# Patient Record
Sex: Male | Born: 1955
Health system: Southern US, Community
[De-identification: ages and names within clinical notes are randomized; demographics above are authoritative.]

## PROBLEM LIST (undated history)

## (undated) DIAGNOSIS — S060XAA Concussion with loss of consciousness status unknown, initial encounter: Secondary | ICD-10-CM

## (undated) DIAGNOSIS — G8929 Other chronic pain: Secondary | ICD-10-CM

## (undated) DIAGNOSIS — E119 Type 2 diabetes mellitus without complications: Secondary | ICD-10-CM

## (undated) DIAGNOSIS — T7840XA Allergy, unspecified, initial encounter: Secondary | ICD-10-CM

## (undated) DIAGNOSIS — E049 Nontoxic goiter, unspecified: Secondary | ICD-10-CM

## (undated) DIAGNOSIS — F32A Depression, unspecified: Secondary | ICD-10-CM

## (undated) DIAGNOSIS — T8859XA Other complications of anesthesia, initial encounter: Secondary | ICD-10-CM

## (undated) DIAGNOSIS — F329 Major depressive disorder, single episode, unspecified: Secondary | ICD-10-CM

## (undated) DIAGNOSIS — M4800 Spinal stenosis, site unspecified: Secondary | ICD-10-CM

## (undated) DIAGNOSIS — F419 Anxiety disorder, unspecified: Secondary | ICD-10-CM

## (undated) DIAGNOSIS — G4730 Sleep apnea, unspecified: Secondary | ICD-10-CM

## (undated) DIAGNOSIS — R112 Nausea with vomiting, unspecified: Secondary | ICD-10-CM

## (undated) DIAGNOSIS — C61 Malignant neoplasm of prostate: Secondary | ICD-10-CM

## (undated) DIAGNOSIS — E785 Hyperlipidemia, unspecified: Secondary | ICD-10-CM

## (undated) DIAGNOSIS — I10 Essential (primary) hypertension: Secondary | ICD-10-CM

## (undated) DIAGNOSIS — M549 Dorsalgia, unspecified: Secondary | ICD-10-CM

## (undated) DIAGNOSIS — K219 Gastro-esophageal reflux disease without esophagitis: Secondary | ICD-10-CM

## (undated) DIAGNOSIS — J449 Chronic obstructive pulmonary disease, unspecified: Secondary | ICD-10-CM

## (undated) DIAGNOSIS — G709 Myoneural disorder, unspecified: Secondary | ICD-10-CM

## (undated) DIAGNOSIS — S060X9A Concussion with loss of consciousness of unspecified duration, initial encounter: Secondary | ICD-10-CM

## (undated) DIAGNOSIS — I739 Peripheral vascular disease, unspecified: Secondary | ICD-10-CM

## (undated) DIAGNOSIS — M1990 Unspecified osteoarthritis, unspecified site: Secondary | ICD-10-CM

## (undated) DIAGNOSIS — I499 Cardiac arrhythmia, unspecified: Secondary | ICD-10-CM

## (undated) DIAGNOSIS — K759 Inflammatory liver disease, unspecified: Secondary | ICD-10-CM

## (undated) HISTORY — DX: Depression, unspecified: F32.A

## (undated) HISTORY — PX: FRACTURE SURGERY: SHX138

## (undated) HISTORY — DX: Spinal stenosis, site unspecified: M48.00

## (undated) HISTORY — DX: Essential (primary) hypertension: I10

## (undated) HISTORY — DX: Concussion with loss of consciousness of unspecified duration, initial encounter: S06.0X9A

## (undated) HISTORY — DX: Hyperlipidemia, unspecified: E78.5

## (undated) HISTORY — DX: Nontoxic goiter, unspecified: E04.9

## (undated) HISTORY — PX: SPINE SURGERY: SHX786

## (undated) HISTORY — DX: Sleep apnea, unspecified: G47.30

## (undated) HISTORY — DX: Other chronic pain: G89.29

## (undated) HISTORY — DX: Malignant neoplasm of prostate: C61

## (undated) HISTORY — DX: Concussion with loss of consciousness status unknown, initial encounter: S06.0XAA

## (undated) HISTORY — DX: Allergy, unspecified, initial encounter: T78.40XA

## (undated) HISTORY — DX: Unspecified osteoarthritis, unspecified site: M19.90

## (undated) HISTORY — DX: Dorsalgia, unspecified: M54.9

## (undated) HISTORY — DX: Anxiety disorder, unspecified: F41.9

## (undated) HISTORY — PX: PROSTATE SURGERY: SHX751

## (undated) HISTORY — PX: OTHER SURGICAL HISTORY: SHX169

## (undated) HISTORY — DX: Major depressive disorder, single episode, unspecified: F32.9

---

## 2015-05-17 DIAGNOSIS — R972 Elevated prostate specific antigen [PSA]: Secondary | ICD-10-CM | POA: Insufficient documentation

## 2015-05-17 DIAGNOSIS — N400 Enlarged prostate without lower urinary tract symptoms: Secondary | ICD-10-CM | POA: Insufficient documentation

## 2015-07-29 ENCOUNTER — Telehealth: Payer: Self-pay | Admitting: Family Medicine

## 2015-07-29 NOTE — Telephone Encounter (Signed)
Pt called stated he needs refill on Lorazepam. Pharm is [facility] in [place]. Pt last seen in 01/28/15. Thanks.

## 2015-08-08 ENCOUNTER — Encounter: Payer: Self-pay | Admitting: Family Medicine

## 2015-08-08 ENCOUNTER — Ambulatory Visit (INDEPENDENT_AMBULATORY_CARE_PROVIDER_SITE_OTHER): Payer: 59 | Admitting: Family Medicine

## 2015-08-08 VITALS — BP 117/69 | HR 68 | Temp 98.8°F | Ht 68.5 in | Wt 193.0 lb

## 2015-08-08 DIAGNOSIS — F329 Major depressive disorder, single episode, unspecified: Secondary | ICD-10-CM

## 2015-08-08 DIAGNOSIS — I10 Essential (primary) hypertension: Secondary | ICD-10-CM | POA: Diagnosis not present

## 2015-08-08 DIAGNOSIS — F32A Depression, unspecified: Secondary | ICD-10-CM | POA: Insufficient documentation

## 2015-08-08 DIAGNOSIS — E785 Hyperlipidemia, unspecified: Secondary | ICD-10-CM

## 2015-08-08 LAB — LP+ALT+AST PICCOLO, WAIVED
ALT (SGPT) Piccolo, Waived: 32 U/L (ref 10–47)
AST (SGOT) Piccolo, Waived: 29 U/L (ref 11–38)
Chol/HDL Ratio Piccolo,Waive: 3 mg/dL
Cholesterol Piccolo, Waived: 152 mg/dL (ref <200)
HDL CHOL PICCOLO, WAIVED: 51 mg/dL — AB (ref >59)
LDL CHOL CALC PICCOLO WAIVED: 64 mg/dL (ref <100)
TRIGLYCERIDES PICCOLO,WAIVED: 187 mg/dL — AB (ref <150)
VLDL CHOL CALC PICCOLO,WAIVE: 37 mg/dL — AB (ref <30)

## 2015-08-08 MED ORDER — ATORVASTATIN CALCIUM 40 MG PO TABS: 40.0000 mg | ORAL_TABLET | Freq: Every day | ORAL | Status: DC

## 2015-08-08 MED ORDER — CITALOPRAM HYDROBROMIDE 40 MG PO TABS: 40.0000 mg | ORAL_TABLET | Freq: Every day | ORAL | Status: DC

## 2015-08-08 MED ORDER — LISINOPRIL 20 MG PO TABS: 20.0000 mg | ORAL_TABLET | Freq: Every day | ORAL | Status: DC

## 2015-08-08 MED ORDER — LORAZEPAM 1 MG PO TABS
1.0000 mg | ORAL_TABLET | Freq: Every day | ORAL | Status: DC | PRN
Start: 2015-08-08 — End: 2016-03-17

## 2015-08-08 NOTE — Assessment & Plan Note (Signed)
The current medical regimen is effective;  continue present plan and medications.  

## 2015-08-08 NOTE — Progress Notes (Signed)
   BP 117/69 mmHg  Pulse 68  Temp(Src) 98.8 F (37.1 C)  Ht 5' 8.5" (1.74 m)  Wt 193 lb (87.544 kg)  BMI 28.92 kg/m2  SpO2 97%   Subjective:    Patient ID: William Sexton, male    DOB: March 25, 1956, 59 y.o.   MRN: [id number]  HPI: William Sexton is a 59 y.o. male  Chief Complaint  Patient presents with  . Anxiety    med refill   patient doing well on medications cholesterol and blood pressure no side effects from medications takes faithfully. Anxiety depression doing stable has used 60 lorazepam and this last year on chart review Wanting refill having some extra stress with his granddaughter at this time. Celexa is doing okay for depression.   Relevant past medical, surgical, family and social history reviewed and updated as indicated. Interim medical history since our last visit reviewed. Allergies and medications reviewed and updated.  Review of Systems  Constitutional: Negative.   Respiratory: Negative.   Cardiovascular: Negative.     Per HPI unless specifically indicated above     Objective:    BP 117/69 mmHg  Pulse 68  Temp(Src) 98.8 F (37.1 C)  Ht 5' 8.5" (1.74 m)  Wt 193 lb (87.544 kg)  BMI 28.92 kg/m2  SpO2 97%  Wt Readings from Last 3 Encounters:  08/08/15 193 lb (87.544 kg)  01/28/15 197 lb (89.359 kg)    Physical Exam  Constitutional: He is oriented to person, place, and time. He appears well-developed and well-nourished. No distress.  HENT:  Head: Normocephalic and atraumatic.  Right Ear: Hearing normal.  Left Ear: Hearing normal.  Nose: Nose normal.  Eyes: Conjunctivae and lids are normal. Right eye exhibits no discharge. Left eye exhibits no discharge. No scleral icterus.  Cardiovascular: Normal rate, regular rhythm and normal heart sounds.   Pulmonary/Chest: Effort normal and breath sounds normal. No respiratory distress.  Musculoskeletal: Normal range of motion.  Neurological: He is alert and oriented to person, place, and time.  Skin: Skin is  intact. No rash noted.  Psychiatric: He has a normal mood and affect. His speech is normal and behavior is normal. Judgment and thought content normal. Cognition and memory are normal.    No results found for this or any previous visit.    Assessment & Plan:   Problem List Items Addressed This Visit      Cardiovascular and Mediastinum   Hypertension - Primary    The current medical regimen is effective;  continue present plan and medications.       Relevant Medications   atorvastatin (LIPITOR) 40 MG tablet   lisinopril (PRINIVIL,ZESTRIL) 20 MG tablet   Other Relevant Orders   Basic metabolic panel     Other   Depression    The current medical regimen is effective;  continue present plan and medications.       Relevant Medications   citalopram (CELEXA) 40 MG tablet   LORazepam (ATIVAN) 1 MG tablet   Hyperlipidemia    The current medical regimen is effective;  continue present plan and medications.       Relevant Medications   atorvastatin (LIPITOR) 40 MG tablet   lisinopril (PRINIVIL,ZESTRIL) 20 MG tablet   Other Relevant Orders   LP+ALT+AST Piccolo, Waived       Follow up plan: Return in about 3 months (around 11/08/2015) for Physical Exam.

## 2015-08-09 LAB — BASIC METABOLIC PANEL
BUN/Creatinine Ratio: 13 (ref 9–20)
BUN: 10 mg/dL (ref 6–24)
CO2: 26 mmol/L (ref 18–29)
CREATININE: 0.8 mg/dL (ref 0.76–1.27)
Calcium: 9.6 mg/dL (ref 8.7–10.2)
Chloride: 100 mmol/L (ref 97–108)
GFR, EST AFRICAN AMERICAN: 114 mL/min/{1.73_m2} (ref >59)
GFR, EST NON AFRICAN AMERICAN: 98 mL/min/{1.73_m2} (ref >59)
Glucose: 82 mg/dL (ref 65–99)
Potassium: 4.7 mmol/L (ref 3.5–5.2)
SODIUM: 141 mmol/L (ref 134–144)

## 2015-08-12 ENCOUNTER — Encounter: Payer: Self-pay | Admitting: Family Medicine

## 2015-10-31 ENCOUNTER — Ambulatory Visit (INDEPENDENT_AMBULATORY_CARE_PROVIDER_SITE_OTHER): Payer: 59 | Admitting: Family Medicine

## 2015-10-31 ENCOUNTER — Encounter: Payer: Self-pay | Admitting: Family Medicine

## 2015-10-31 VITALS — BP 109/68 | HR 72 | Temp 98.4°F | Ht 68.3 in | Wt 195.0 lb

## 2015-10-31 DIAGNOSIS — F329 Major depressive disorder, single episode, unspecified: Secondary | ICD-10-CM

## 2015-10-31 DIAGNOSIS — I10 Essential (primary) hypertension: Secondary | ICD-10-CM

## 2015-10-31 DIAGNOSIS — E785 Hyperlipidemia, unspecified: Secondary | ICD-10-CM | POA: Diagnosis not present

## 2015-10-31 DIAGNOSIS — Z0000 Encounter for general adult medical examination without abnormal findings: Secondary | ICD-10-CM

## 2015-10-31 DIAGNOSIS — F32A Depression, unspecified: Secondary | ICD-10-CM

## 2015-10-31 LAB — URINALYSIS, ROUTINE W REFLEX MICROSCOPIC
Bilirubin, UA: NEGATIVE
GLUCOSE, UA: NEGATIVE
Ketones, UA: NEGATIVE
Leukocytes, UA: NEGATIVE
Nitrite, UA: NEGATIVE
Protein, UA: NEGATIVE
RBC, UA: NEGATIVE
Specific Gravity, UA: 1.03 (ref 1.005–1.030)
UUROB: 0.2 mg/dL (ref 0.2–1.0)
pH, UA: 5.5 (ref 5.0–7.5)

## 2015-10-31 MED ORDER — ATORVASTATIN CALCIUM 40 MG PO TABS: 40.0000 mg | ORAL_TABLET | Freq: Every day | ORAL | Status: DC

## 2015-10-31 MED ORDER — CITALOPRAM HYDROBROMIDE 40 MG PO TABS: 40.0000 mg | ORAL_TABLET | Freq: Every day | ORAL | Status: DC

## 2015-10-31 MED ORDER — LISINOPRIL 20 MG PO TABS: 20.0000 mg | ORAL_TABLET | Freq: Every day | ORAL | Status: DC

## 2015-10-31 NOTE — Assessment & Plan Note (Signed)
The current medical regimen is effective;  continue present plan and medications.  

## 2015-10-31 NOTE — Progress Notes (Signed)
BP 109/68 mmHg  Pulse 72  Temp(Src) 98.4 F (36.9 C)  Ht 5' 8.3" (1.735 m)  Wt 195 lb (88.451 kg)  BMI 29.38 kg/m2  SpO2 95%   Subjective:    Patient ID: William Sexton, male    DOB: 1956-01-21, 59 y.o.   MRN: [id number]  HPI: William Sexton is a 59 y.o. male  Chief Complaint  Patient presents with  . Annual Exam   patient with a cold for the last week or so wakes up to 3:00 coughing some some nasal congestion and drainage no fever or chills His taken some Tylenol no other medicines also tried some Mucinex didn't work  Patient taken citalopram 40 mg tried to cut back on medications to 20 because he is concerned about possibility of heart rhythm problems which he has none. And started getting irritable and has gone back on full dose and doing okay now. Takes Lipitor with no problems Lisinopril with no problems Rare use of lorazepam   Relevant past medical, surgical, family and social history reviewed and updated as indicated. Interim medical history since our last visit reviewed. Allergies and medications reviewed and updated.  Review of Systems  Constitutional: Negative.   HENT: Negative.   Eyes: Negative.   Respiratory: Negative.   Cardiovascular: Negative.   Gastrointestinal: Negative.   Endocrine: Negative.   Genitourinary: Negative.   Musculoskeletal: Negative.   Skin: Negative.   Allergic/Immunologic: Negative.   Neurological: Negative.   Hematological: Negative.   Psychiatric/Behavioral: Negative.     Per HPI unless specifically indicated above     Objective:    BP 109/68 mmHg  Pulse 72  Temp(Src) 98.4 F (36.9 C)  Ht 5' 8.3" (1.735 m)  Wt 195 lb (88.451 kg)  BMI 29.38 kg/m2  SpO2 95%  Wt Readings from Last 3 Encounters:  10/31/15 195 lb (88.451 kg)  08/08/15 193 lb (87.544 kg)  01/28/15 197 lb (89.359 kg)    Physical Exam  Constitutional: He is oriented to person, place, and time. He appears well-developed and well-nourished.  HENT:  Head:  Normocephalic and atraumatic.  Right Ear: External ear normal.  Left Ear: External ear normal.  Eyes: Conjunctivae and EOM are normal. Pupils are equal, round, and reactive to light.  Neck: Normal range of motion. Neck supple.  Cardiovascular: Normal rate, regular rhythm, normal heart sounds and intact distal pulses.   Pulmonary/Chest: Effort normal and breath sounds normal.  Abdominal: Soft. Bowel sounds are normal. There is no splenomegaly or hepatomegaly.  Genitourinary: Rectum normal, prostate normal and penis normal.  Musculoskeletal: Normal range of motion.  Neurological: He is alert and oriented to person, place, and time. He has normal reflexes.  Skin: No rash noted. No erythema.  Psychiatric: He has a normal mood and affect. His behavior is normal. Judgment and thought content normal.    Results for orders placed or performed in visit on 08/08/15  LP+ALT+AST Piccolo, [employer]  Result Value Ref Range   ALT (SGPT) Piccolo, Waived 32 10 - 47 U/L   AST (SGOT) Piccolo, Waived 29 11 - 38 U/L   Cholesterol Piccolo, Waived 152 <200 mg/dL   HDL Chol Piccolo, Waived 51 (L) >59 mg/dL   Triglycerides Piccolo,Waived 187 (H) <150 mg/dL   Chol/HDL Ratio Piccolo,Waive 3.0 mg/dL   LDL Chol Calc Piccolo Waived 64 <100 mg/dL   VLDL Chol Calc Piccolo,Waive 37 (H) <30 mg/dL  Basic metabolic panel  Result Value Ref Range   Glucose 82 65 - 99 mg/dL  BUN 10 6 - 24 mg/dL   Creatinine, Ser 0.80 0.76 - 1.27 mg/dL   GFR calc non Af Amer 98 >59 mL/min/1.73   GFR calc Af Amer 114 >59 mL/min/1.73   BUN/Creatinine Ratio 13 9 - 20   Sodium 141 134 - 144 mmol/L   Potassium 4.7 3.5 - 5.2 mmol/L   Chloride 100 97 - 108 mmol/L   CO2 26 18 - 29 mmol/L   Calcium 9.6 8.7 - 10.2 mg/dL      Assessment & Plan:   Problem List Items Addressed This Visit      Cardiovascular and Mediastinum   Hypertension    The current medical regimen is effective;  continue present plan and medications.       Relevant  Medications   atorvastatin (LIPITOR) 40 MG tablet   lisinopril (PRINIVIL,ZESTRIL) 20 MG tablet     Other   Hyperlipidemia    The current medical regimen is effective;  continue present plan and medications.       Relevant Medications   atorvastatin (LIPITOR) 40 MG tablet   lisinopril (PRINIVIL,ZESTRIL) 20 MG tablet   Depression    The current medical regimen is effective;  continue present plan and medications.       Relevant Medications   citalopram (CELEXA) 40 MG tablet    Other Visit Diagnoses    Routine general medical examination at a health care facility    -  Primary    Relevant Orders    CBC with Differential/Platelet    Comprehensive metabolic panel    Lipid Panel w/o Chol/HDL Ratio    PSA    TSH    Urinalysis, Routine w reflex microscopic (not at [facility])        Follow up plan: Return in about 6 months (around 04/29/2016) for BMP , lipids, alt, ast.

## 2015-11-01 LAB — CBC WITH DIFFERENTIAL/PLATELET
BASOS ABS: 0 10*3/uL (ref 0.0–0.2)
Basos: 0 %
EOS (ABSOLUTE): 0.1 10*3/uL (ref 0.0–0.4)
Eos: 1 %
Hematocrit: 42.4 % (ref 37.5–51.0)
Hemoglobin: 14.4 g/dL (ref 12.6–17.7)
IMMATURE GRANS (ABS): 0.1 10*3/uL (ref 0.0–0.1)
Immature Granulocytes: 1 %
LYMPHS: 21 %
Lymphocytes Absolute: 2 10*3/uL (ref 0.7–3.1)
MCH: 30.4 pg (ref 26.6–33.0)
MCHC: 34 g/dL (ref 31.5–35.7)
MCV: 90 fL (ref 79–97)
MONOS ABS: 1.1 10*3/uL — AB (ref 0.1–0.9)
Monocytes: 11 %
NEUTROS ABS: 6.3 10*3/uL (ref 1.4–7.0)
NEUTROS PCT: 66 %
PLATELETS: 257 10*3/uL (ref 150–379)
RBC: 4.74 x10E6/uL (ref 4.14–5.80)
RDW: 13.2 % (ref 12.3–15.4)
WBC: 9.6 10*3/uL (ref 3.4–10.8)

## 2015-11-01 LAB — LIPID PANEL W/O CHOL/HDL RATIO
CHOLESTEROL TOTAL: 156 mg/dL (ref 100–199)
HDL: 39 mg/dL — AB (ref >39)
LDL Calculated: 82 mg/dL (ref 0–99)
TRIGLYCERIDES: 175 mg/dL — AB (ref 0–149)
VLDL CHOLESTEROL CAL: 35 mg/dL (ref 5–40)

## 2015-11-01 LAB — COMPREHENSIVE METABOLIC PANEL
ALK PHOS: 87 IU/L (ref 39–117)
ALT: 50 IU/L — AB (ref 0–44)
AST: 24 IU/L (ref 0–40)
Albumin/Globulin Ratio: 1.7 (ref 1.1–2.5)
Albumin: 4.3 g/dL (ref 3.5–5.5)
BILIRUBIN TOTAL: 0.3 mg/dL (ref 0.0–1.2)
BUN/Creatinine Ratio: 13 (ref 9–20)
BUN: 12 mg/dL (ref 6–24)
CHLORIDE: 97 mmol/L (ref 97–106)
CO2: 25 mmol/L (ref 18–29)
Calcium: 9.3 mg/dL (ref 8.7–10.2)
Creatinine, Ser: 0.89 mg/dL (ref 0.76–1.27)
GFR calc Af Amer: 109 mL/min/{1.73_m2} (ref >59)
GFR calc non Af Amer: 94 mL/min/{1.73_m2} (ref >59)
GLUCOSE: 157 mg/dL — AB (ref 65–99)
Globulin, Total: 2.5 g/dL (ref 1.5–4.5)
POTASSIUM: 4.4 mmol/L (ref 3.5–5.2)
Sodium: 137 mmol/L (ref 136–144)
TOTAL PROTEIN: 6.8 g/dL (ref 6.0–8.5)

## 2015-11-01 LAB — PSA: Prostate Specific Ag, Serum: 4.5 ng/mL — ABNORMAL HIGH (ref 0.0–4.0)

## 2015-11-01 LAB — TSH: TSH: 0.796 u[IU]/mL (ref 0.450–4.500)

## 2015-11-05 ENCOUNTER — Telehealth: Payer: Self-pay | Admitting: Family Medicine

## 2015-11-05 DIAGNOSIS — R748 Abnormal levels of other serum enzymes: Secondary | ICD-10-CM

## 2015-11-05 NOTE — Telephone Encounter (Signed)
Phone call Discussed with patient elevated PSA patient's been followed by urology every 6 months Discuss elevated liver enzymes patient not drinking alcohol is taking a lot of medicines to try to get rid of cold Will recheck comprehensive metabolic panel in a couple of months

## 2015-11-05 NOTE — Telephone Encounter (Signed)
-----   Message from Wynn Maudlin, [place] sent at 11/05/2015 12:02 PM EST ----- labs

## 2016-03-10 ENCOUNTER — Ambulatory Visit: Payer: 59 | Admitting: Family Medicine

## 2016-03-17 ENCOUNTER — Ambulatory Visit (INDEPENDENT_AMBULATORY_CARE_PROVIDER_SITE_OTHER): Payer: 59 | Admitting: Family Medicine

## 2016-03-17 ENCOUNTER — Encounter: Payer: Self-pay | Admitting: Family Medicine

## 2016-03-17 VITALS — BP 121/70 | HR 71 | Temp 98.6°F | Ht 68.3 in | Wt 197.0 lb

## 2016-03-17 DIAGNOSIS — F329 Major depressive disorder, single episode, unspecified: Secondary | ICD-10-CM | POA: Diagnosis not present

## 2016-03-17 DIAGNOSIS — I10 Essential (primary) hypertension: Secondary | ICD-10-CM | POA: Diagnosis not present

## 2016-03-17 DIAGNOSIS — F32A Depression, unspecified: Secondary | ICD-10-CM

## 2016-03-17 NOTE — Assessment & Plan Note (Signed)
The current medical regimen is effective;  continue present plan and medications.  

## 2016-03-17 NOTE — Progress Notes (Signed)
BP 121/70 mmHg  Pulse 71  Temp(Src) 98.6 F (37 C)  Ht 5' 8.3" (1.735 m)  Wt 197 lb (89.359 kg)  BMI 29.69 kg/m2  SpO2 98%   Subjective:    Patient ID: William Sexton, male    DOB: 27-Jun-1956, 60 y.o.   MRN: [id number]  HPI: William Sexton is a 60 y.o. male  Chief Complaint  Patient presents with  . wants to reduce medication  . Gastroesophageal Reflux  . hand swelling   Patient taken citalopram 40 mg one half tablet concerned has been on citalopram 40 mg for years slipping 2 reduced dose and stop doing okay at best Still having some irritability Patient has intermittent hand swelling when he eats too much salt hand seemed to swell in the next day wondering about fluid retention doesn't have in his legs or feet Has occasional reflux takes over-the-counter seems to help okay  Relevant past medical, surgical, family and social history reviewed and updated as indicated. Interim medical history since our last visit reviewed. Allergies and medications reviewed and updated.  Review of Systems  Constitutional: Negative.   Respiratory: Negative.   Cardiovascular: Negative.     Per HPI unless specifically indicated above     Objective:    BP 121/70 mmHg  Pulse 71  Temp(Src) 98.6 F (37 C)  Ht 5' 8.3" (1.735 m)  Wt 197 lb (89.359 kg)  BMI 29.69 kg/m2  SpO2 98%  Wt Readings from Last 3 Encounters:  03/17/16 197 lb (89.359 kg)  10/31/15 195 lb (88.451 kg)  08/08/15 193 lb (87.544 kg)    Physical Exam  Constitutional: He is oriented to person, place, and time. He appears well-developed and well-nourished. No distress.  HENT:  Head: Normocephalic and atraumatic.  Right Ear: Hearing normal.  Left Ear: Hearing normal.  Nose: Nose normal.  Eyes: Conjunctivae and lids are normal. Right eye exhibits no discharge. Left eye exhibits no discharge. No scleral icterus.  Cardiovascular: Normal rate, regular rhythm and normal heart sounds.   Pulmonary/Chest: Effort normal and  breath sounds normal. No respiratory distress.  Musculoskeletal: Normal range of motion.  Neurological: He is alert and oriented to person, place, and time.  Skin: Skin is intact. No rash noted.  Psychiatric: He has a normal mood and affect. His speech is normal and behavior is normal. Judgment and thought content normal. Cognition and memory are normal.    Results for orders placed or performed in visit on 10/31/15  CBC with Differential/Platelet  Result Value Ref Range   WBC 9.6 3.4 - 10.8 x10E3/uL   RBC 4.74 4.14 - 5.80 x10E6/uL   Hemoglobin 14.4 12.6 - 17.7 g/dL   Hematocrit 42.4 37.5 - 51.0 %   MCV 90 79 - 97 fL   MCH 30.4 26.6 - 33.0 pg   MCHC 34.0 31.5 - 35.7 g/dL   RDW 13.2 12.3 - 15.4 %   Platelets 257 150 - 379 x10E3/uL   Neutrophils 66 %   Lymphs 21 %   Monocytes 11 %   Eos 1 %   Basos 0 %   Neutrophils Absolute 6.3 1.4 - 7.0 x10E3/uL   Lymphocytes Absolute 2.0 0.7 - 3.1 x10E3/uL   Monocytes Absolute 1.1 (H) 0.1 - 0.9 x10E3/uL   EOS (ABSOLUTE) 0.1 0.0 - 0.4 x10E3/uL   Basophils Absolute 0.0 0.0 - 0.2 x10E3/uL   Immature Granulocytes 1 %   Immature Grans (Abs) 0.1 0.0 - 0.1 x10E3/uL  Comprehensive metabolic panel  Result Value  Ref Range   Glucose 157 (H) 65 - 99 mg/dL   BUN 12 6 - 24 mg/dL   Creatinine, Ser 0.89 0.76 - 1.27 mg/dL   GFR calc non Af Amer 94 >59 mL/min/1.73   GFR calc Af Amer 109 >59 mL/min/1.73   BUN/Creatinine Ratio 13 9 - 20   Sodium 137 136 - 144 mmol/L   Potassium 4.4 3.5 - 5.2 mmol/L   Chloride 97 97 - 106 mmol/L   CO2 25 18 - 29 mmol/L   Calcium 9.3 8.7 - 10.2 mg/dL   Total Protein 6.8 6.0 - 8.5 g/dL   Albumin 4.3 3.5 - 5.5 g/dL   Globulin, Total 2.5 1.5 - 4.5 g/dL   Albumin/Globulin Ratio 1.7 1.1 - 2.5   Bilirubin Total 0.3 0.0 - 1.2 mg/dL   Alkaline Phosphatase 87 39 - 117 IU/L   AST 24 0 - 40 IU/L   ALT 50 (H) 0 - 44 IU/L  Lipid Panel w/o Chol/HDL Ratio  Result Value Ref Range   Cholesterol, Total 156 100 - 199 mg/dL    Triglycerides 175 (H) 0 - 149 mg/dL   HDL 39 (L) >39 mg/dL   VLDL Cholesterol Cal 35 5 - 40 mg/dL   LDL Calculated 82 0 - 99 mg/dL  PSA  Result Value Ref Range   Prostate Specific Ag, Serum 4.5 (H) 0.0 - 4.0 ng/mL  TSH  Result Value Ref Range   TSH 0.796 0.450 - 4.500 uIU/mL  Urinalysis, Routine w reflex microscopic (not at [facility])  Result Value Ref Range   Specific Gravity, UA 1.030 1.005 - 1.030   pH, UA 5.5 5.0 - 7.5   Color, UA Yellow Yellow   Appearance Ur Clear Clear   Leukocytes, UA Negative Negative   Protein, UA Negative Negative/Trace   Glucose, UA Negative Negative   Ketones, UA Negative Negative   RBC, UA Negative Negative   Bilirubin, UA Negative Negative   Urobilinogen, Ur 0.2 0.2 - 1.0 mg/dL   Nitrite, UA Negative Negative      Assessment & Plan:   Problem List Items Addressed This Visit      Cardiovascular and Mediastinum   Hypertension    The current medical regimen is effective;  continue present plan and medications.         Other   Depression - Primary    Some increased irritability with reduced dose of citalopram 40 to 20 mg Will continue on 20 mg observe response          Follow up plan: Return in about 2 months (around 05/17/2016) for BMP, lipids, alt, ast.

## 2016-03-17 NOTE — Assessment & Plan Note (Signed)
Some increased irritability with reduced dose of citalopram 40 to 20 mg Will continue on 20 mg observe response

## 2016-04-06 ENCOUNTER — Telehealth: Payer: Self-pay | Admitting: Family Medicine

## 2016-04-06 NOTE — Telephone Encounter (Signed)
Pt called stated he is taking his Citalopram 40mg  again. Wanted to let Dr. Jeananne Rama. Thanks.

## 2016-04-29 ENCOUNTER — Ambulatory Visit: Payer: 59 | Admitting: Family Medicine

## 2016-06-11 ENCOUNTER — Other Ambulatory Visit: Payer: Self-pay | Admitting: Family Medicine

## 2016-11-03 ENCOUNTER — Encounter: Payer: Self-pay | Admitting: Family Medicine

## 2016-11-03 ENCOUNTER — Ambulatory Visit (INDEPENDENT_AMBULATORY_CARE_PROVIDER_SITE_OTHER): Payer: 59 | Admitting: Family Medicine

## 2016-11-03 VITALS — BP 134/77 | HR 61 | Temp 98.3°F | Ht 69.0 in | Wt 193.0 lb

## 2016-11-03 DIAGNOSIS — I10 Essential (primary) hypertension: Secondary | ICD-10-CM | POA: Diagnosis not present

## 2016-11-03 DIAGNOSIS — Z0000 Encounter for general adult medical examination without abnormal findings: Secondary | ICD-10-CM | POA: Diagnosis not present

## 2016-11-03 DIAGNOSIS — F325 Major depressive disorder, single episode, in full remission: Secondary | ICD-10-CM | POA: Diagnosis not present

## 2016-11-03 DIAGNOSIS — Z125 Encounter for screening for malignant neoplasm of prostate: Secondary | ICD-10-CM | POA: Diagnosis not present

## 2016-11-03 DIAGNOSIS — R748 Abnormal levels of other serum enzymes: Secondary | ICD-10-CM

## 2016-11-03 DIAGNOSIS — E7800 Pure hypercholesterolemia, unspecified: Secondary | ICD-10-CM

## 2016-11-03 DIAGNOSIS — Z1211 Encounter for screening for malignant neoplasm of colon: Secondary | ICD-10-CM | POA: Diagnosis not present

## 2016-11-03 DIAGNOSIS — G4730 Sleep apnea, unspecified: Secondary | ICD-10-CM

## 2016-11-03 DIAGNOSIS — E782 Mixed hyperlipidemia: Secondary | ICD-10-CM

## 2016-11-03 DIAGNOSIS — E049 Nontoxic goiter, unspecified: Secondary | ICD-10-CM

## 2016-11-03 LAB — URINALYSIS, ROUTINE W REFLEX MICROSCOPIC
Bilirubin, UA: NEGATIVE
GLUCOSE, UA: NEGATIVE
KETONES UA: NEGATIVE
LEUKOCYTES UA: NEGATIVE
NITRITE UA: NEGATIVE
Protein, UA: NEGATIVE
RBC, UA: NEGATIVE
Specific Gravity, UA: 1.015 (ref 1.005–1.030)
Urobilinogen, Ur: 0.2 mg/dL (ref 0.2–1.0)
pH, UA: 7 (ref 5.0–7.5)

## 2016-11-03 MED ORDER — CITALOPRAM HYDROBROMIDE 40 MG PO TABS: 40.0000 mg | ORAL_TABLET | Freq: Every day | ORAL | 12 refills | Status: DC

## 2016-11-03 MED ORDER — LISINOPRIL 20 MG PO TABS: 20.0000 mg | ORAL_TABLET | Freq: Every day | ORAL | 12 refills | Status: DC

## 2016-11-03 MED ORDER — ATORVASTATIN CALCIUM 40 MG PO TABS: 40.0000 mg | ORAL_TABLET | Freq: Every day | ORAL | 12 refills | Status: DC

## 2016-11-03 NOTE — Assessment & Plan Note (Signed)
The current medical regimen is effective;  continue present plan and medications.  

## 2016-11-03 NOTE — Assessment & Plan Note (Signed)
Discussed sleep apnea importance of using CPAP and will consider turning off ramp and trying to read at night before going to bed to help get used to this machine.

## 2016-11-03 NOTE — Progress Notes (Signed)
BP 134/77   Pulse 61   Temp 98.3 F (36.8 C)   Ht 5\' 9"  (1.753 m)   Wt 193 lb (87.5 kg)   SpO2 98%   BMI 28.50 kg/m    Subjective:    Patient ID: William Sexton, male    DOB: May 06, 1956, 60 y.o.   MRN: [id number]  HPI: William Sexton is a 60 y.o. male  Chief Complaint  Patient presents with  . Annual Exam  Patient doing well no complaints blood pressure medicines and's citalopram cholesterol no issues. Taking medicines faithfully without side effects. Trying to get used to his new CPAP machine for sleep apnea. Patient had normal thyroid biopsy previously and is doing well.  Relevant past medical, surgical, family and social history reviewed and updated as indicated. Interim medical history since our last visit reviewed. Allergies and medications reviewed and updated.  Review of Systems  Constitutional: Negative.   HENT: Negative.   Eyes: Negative.   Respiratory: Negative.   Cardiovascular: Negative.   Gastrointestinal: Negative.   Endocrine: Negative.   Genitourinary: Negative.   Musculoskeletal: Negative.   Skin: Negative.   Allergic/Immunologic: Negative.   Neurological: Negative.   Hematological: Negative.   Psychiatric/Behavioral: Negative.     Per HPI unless specifically indicated above     Objective:    BP 134/77   Pulse 61   Temp 98.3 F (36.8 C)   Ht 5\' 9"  (1.753 m)   Wt 193 lb (87.5 kg)   SpO2 98%   BMI 28.50 kg/m   Wt Readings from Last 3 Encounters:  11/03/16 193 lb (87.5 kg)  03/17/16 197 lb (89.4 kg)  10/31/15 195 lb (88.5 kg)    Physical Exam  Constitutional: He is oriented to person, place, and time. He appears well-developed and well-nourished.  HENT:  Head: Normocephalic and atraumatic.  Right Ear: External ear normal.  Left Ear: External ear normal.  Eyes: Conjunctivae and EOM are normal. Pupils are equal, round, and reactive to light.  Neck: Normal range of motion. Neck supple. Thyromegaly present.  Cardiovascular: Normal rate,  regular rhythm, normal heart sounds and intact distal pulses.   Pulmonary/Chest: Effort normal and breath sounds normal.  Abdominal: Soft. Bowel sounds are normal. There is no splenomegaly or hepatomegaly.  Genitourinary:  Genitourinary Comments: Done at [facility]  Musculoskeletal: Normal range of motion.  Neurological: He is alert and oriented to person, place, and time. He has normal reflexes.  Skin: No rash noted. No erythema.  Psychiatric: He has a normal mood and affect. His behavior is normal. Judgment and thought content normal.    Results for orders placed or performed in visit on 10/31/15  CBC with Differential/Platelet  Result Value Ref Range   WBC 9.6 3.4 - 10.8 x10E3/uL   RBC 4.74 4.14 - 5.80 x10E6/uL   Hemoglobin 14.4 12.6 - 17.7 g/dL   Hematocrit 42.4 37.5 - 51.0 %   MCV 90 79 - 97 fL   MCH 30.4 26.6 - 33.0 pg   MCHC 34.0 31.5 - 35.7 g/dL   RDW 13.2 12.3 - 15.4 %   Platelets 257 150 - 379 x10E3/uL   Neutrophils 66 %   Lymphs 21 %   Monocytes 11 %   Eos 1 %   Basos 0 %   Neutrophils Absolute 6.3 1.4 - 7.0 x10E3/uL   Lymphocytes Absolute 2.0 0.7 - 3.1 x10E3/uL   Monocytes Absolute 1.1 (H) 0.1 - 0.9 x10E3/uL   EOS (ABSOLUTE) 0.1 0.0 - 0.4 x10E3/uL  Basophils Absolute 0.0 0.0 - 0.2 x10E3/uL   Immature Granulocytes 1 %   Immature Grans (Abs) 0.1 0.0 - 0.1 x10E3/uL  Comprehensive metabolic panel  Result Value Ref Range   Glucose 157 (H) 65 - 99 mg/dL   BUN 12 6 - 24 mg/dL   Creatinine, Ser 0.89 0.76 - 1.27 mg/dL   GFR calc non Af Amer 94 >59 mL/min/1.73   GFR calc Af Amer 109 >59 mL/min/1.73   BUN/Creatinine Ratio 13 9 - 20   Sodium 137 136 - 144 mmol/L   Potassium 4.4 3.5 - 5.2 mmol/L   Chloride 97 97 - 106 mmol/L   CO2 25 18 - 29 mmol/L   Calcium 9.3 8.7 - 10.2 mg/dL   Total Protein 6.8 6.0 - 8.5 g/dL   Albumin 4.3 3.5 - 5.5 g/dL   Globulin, Total 2.5 1.5 - 4.5 g/dL   Albumin/Globulin Ratio 1.7 1.1 - 2.5   Bilirubin Total 0.3 0.0 - 1.2 mg/dL   Alkaline  Phosphatase 87 39 - 117 IU/L   AST 24 0 - 40 IU/L   ALT 50 (H) 0 - 44 IU/L  Lipid Panel w/o Chol/HDL Ratio  Result Value Ref Range   Cholesterol, Total 156 100 - 199 mg/dL   Triglycerides 175 (H) 0 - 149 mg/dL   HDL 39 (L) >39 mg/dL   VLDL Cholesterol Cal 35 5 - 40 mg/dL   LDL Calculated 82 0 - 99 mg/dL  PSA  Result Value Ref Range   Prostate Specific Ag, Serum 4.5 (H) 0.0 - 4.0 ng/mL  TSH  Result Value Ref Range   TSH 0.796 0.450 - 4.500 uIU/mL  Urinalysis, Routine w reflex microscopic (not at [facility])  Result Value Ref Range   Specific Gravity, UA 1.030 1.005 - 1.030   pH, UA 5.5 5.0 - 7.5   Color, UA Yellow Yellow   Appearance Ur Clear Clear   Leukocytes, UA Negative Negative   Protein, UA Negative Negative/Trace   Glucose, UA Negative Negative   Ketones, UA Negative Negative   RBC, UA Negative Negative   Bilirubin, UA Negative Negative   Urobilinogen, Ur 0.2 0.2 - 1.0 mg/dL   Nitrite, UA Negative Negative      Assessment & Plan:   Problem List Items Addressed This Visit      Cardiovascular and Mediastinum   Hypertension - Primary    The current medical regimen is effective;  continue present plan and medications.       Relevant Medications   atorvastatin (LIPITOR) 40 MG tablet   lisinopril (PRINIVIL,ZESTRIL) 20 MG tablet   Other Relevant Orders   Comprehensive metabolic panel     Respiratory   Sleep apnea    Discussed sleep apnea importance of using CPAP and will consider turning off ramp and trying to read at night before going to bed to help get used to this machine.        Endocrine   Thyroid goiter    Also with nodules but stable by biopsy a couple of weeks ago.        Other   Depression    The current medical regimen is effective;  continue present plan and medications.       Relevant Medications   citalopram (CELEXA) 40 MG tablet   Hyperlipidemia   Relevant Medications   atorvastatin (LIPITOR) 40 MG tablet   lisinopril (PRINIVIL,ZESTRIL)  20 MG tablet   Other Relevant Orders   Lipid Profile    Other Visit  Diagnoses    Elevated liver enzymes       Routine general medical examination at a health care facility       Relevant Orders   CBC with Differential/Platelet   Urinalysis, Routine w reflex microscopic (not at [facility])   TSH   PSA   Screening PSA (prostate specific antigen)       Relevant Orders   PSA   Colon cancer screening       Relevant Orders   Ambulatory referral to Gastroenterology       Follow up plan: Return in about 6 months (around 05/03/2017) for BMP,  Lipids, ALT, AST.

## 2016-11-03 NOTE — Assessment & Plan Note (Signed)
Also with nodules but stable by biopsy a couple of weeks ago.

## 2016-11-04 ENCOUNTER — Telehealth: Payer: Self-pay | Admitting: Family Medicine

## 2016-11-04 ENCOUNTER — Encounter: Payer: Self-pay | Admitting: Family Medicine

## 2016-11-04 LAB — CBC WITH DIFFERENTIAL/PLATELET
BASOS: 0 %
Basophils Absolute: 0 10*3/uL (ref 0.0–0.2)
EOS (ABSOLUTE): 0.1 10*3/uL (ref 0.0–0.4)
EOS: 2 %
Hematocrit: 44.7 % (ref 37.5–51.0)
Hemoglobin: 15.5 g/dL (ref 12.6–17.7)
IMMATURE GRANS (ABS): 0 10*3/uL (ref 0.0–0.1)
IMMATURE GRANULOCYTES: 0 %
LYMPHS: 34 %
Lymphocytes Absolute: 1.7 10*3/uL (ref 0.7–3.1)
MCH: 31.5 pg (ref 26.6–33.0)
MCHC: 34.7 g/dL (ref 31.5–35.7)
MCV: 91 fL (ref 79–97)
Monocytes Absolute: 0.7 10*3/uL (ref 0.1–0.9)
Monocytes: 14 %
NEUTROS PCT: 50 %
Neutrophils Absolute: 2.5 10*3/uL (ref 1.4–7.0)
PLATELETS: 201 10*3/uL (ref 150–379)
RBC: 4.92 x10E6/uL (ref 4.14–5.80)
RDW: 13.1 % (ref 12.3–15.4)
WBC: 5.1 10*3/uL (ref 3.4–10.8)

## 2016-11-04 LAB — COMPREHENSIVE METABOLIC PANEL
A/G RATIO: 2 (ref 1.2–2.2)
ALT: 32 IU/L (ref 0–44)
AST: 25 IU/L (ref 0–40)
Albumin: 4.5 g/dL (ref 3.5–5.5)
Alkaline Phosphatase: 65 IU/L (ref 39–117)
BUN/Creatinine Ratio: 13 (ref 9–20)
BUN: 12 mg/dL (ref 6–24)
Bilirubin Total: 0.7 mg/dL (ref 0.0–1.2)
CALCIUM: 10.2 mg/dL (ref 8.7–10.2)
CO2: 26 mmol/L (ref 18–29)
CREATININE: 0.92 mg/dL (ref 0.76–1.27)
Chloride: 97 mmol/L (ref 96–106)
GFR calc Af Amer: 105 mL/min/{1.73_m2} (ref >59)
GFR, EST NON AFRICAN AMERICAN: 91 mL/min/{1.73_m2} (ref >59)
Globulin, Total: 2.3 g/dL (ref 1.5–4.5)
Glucose: 130 mg/dL — ABNORMAL HIGH (ref 65–99)
POTASSIUM: 4.6 mmol/L (ref 3.5–5.2)
Sodium: 140 mmol/L (ref 134–144)
TOTAL PROTEIN: 6.8 g/dL (ref 6.0–8.5)

## 2016-11-04 LAB — LIPID PANEL
CHOL/HDL RATIO: 3.4 ratio (ref 0.0–5.0)
Cholesterol, Total: 161 mg/dL (ref 100–199)
HDL: 48 mg/dL (ref >39)
LDL CALC: 90 mg/dL (ref 0–99)
TRIGLYCERIDES: 115 mg/dL (ref 0–149)
VLDL CHOLESTEROL CAL: 23 mg/dL (ref 5–40)

## 2016-11-04 LAB — PSA: Prostate Specific Ag, Serum: 7.4 ng/mL — ABNORMAL HIGH (ref 0.0–4.0)

## 2016-11-04 LAB — TSH: TSH: 0.997 u[IU]/mL (ref 0.450–4.500)

## 2016-11-04 LAB — PLEASE NOTE

## 2016-11-04 NOTE — Telephone Encounter (Signed)
Phone call Discussed with patient elevated PSA patient has urologist follow-up with will make an appointment.

## 2016-11-16 ENCOUNTER — Telehealth: Payer: Self-pay

## 2016-11-16 ENCOUNTER — Other Ambulatory Visit: Payer: Self-pay

## 2016-11-16 NOTE — Telephone Encounter (Signed)
Screening Colonoscopy Z12.11 [facility] 01/08/2017 Dr. Vicente Males [facility] Pre cert

## 2016-11-16 NOTE — Telephone Encounter (Signed)
Gastroenterology Pre-Procedure Review  Request Date: 01/08/2017 Requesting Physician: Dr. Jeananne Rama    PATIENT REVIEW QUESTIONS: The patient responded to the following health history questions as indicated:    1. Are you having any GI issues? no 2. Do you have a personal history of Polyps? no 3. Do you have a family history of Colon Cancer or Polyps? no 4. Diabetes Mellitus? no 5. Joint replacements in the past 12 months?no 6. Major health problems in the past 3 months?no 7. Any artificial heart valves, MVP, or defibrillator?no    MEDICATIONS & ALLERGIES:    Patient reports the following regarding taking any anticoagulation/antiplatelet therapy:   Plavix, Coumadin, Eliquis, Xarelto, Lovenox, Pradaxa, Brilinta, or Effient? no Aspirin? no  Patient confirms/reports the following medications:  Current Outpatient Prescriptions  Medication Sig Dispense Refill  . atorvastatin (LIPITOR) 40 MG tablet Take 1 tablet (40 mg total) by mouth daily. 30 tablet 12  . citalopram (CELEXA) 40 MG tablet     . lisinopril (PRINIVIL,ZESTRIL) 20 MG tablet Take 1 tablet (20 mg total) by mouth daily. 30 tablet 12  . naproxen (NAPROSYN) 500 MG tablet Take 500 mg by mouth daily as needed.     . fluticasone (FLONASE) 50 MCG/ACT nasal spray      No current facility-administered medications for this visit.     Patient confirms/reports the following allergies:  Allergies  Allergen Reactions  . Other Other (See Comments)    Environmental/seasonal    No orders of the defined types were placed in this encounter.   AUTHORIZATION INFORMATION Primary Insurance: 1D#: Group #:  Secondary Insurance: 1D#: Group #:  SCHEDULE INFORMATION: Date: 01/08/2017 Time: Location: [place]

## 2016-11-24 NOTE — Telephone Encounter (Signed)
The notification/prior authorization case information was transmitted on 11/24/2016 at 2:34 PM CST. The notification/prior authorization reference number is [id number]. Please print this page for your records.

## 2016-12-28 ENCOUNTER — Telehealth: Payer: Self-pay | Admitting: Family Medicine

## 2016-12-28 ENCOUNTER — Telehealth: Payer: Self-pay | Admitting: Gastroenterology

## 2016-12-28 NOTE — Telephone Encounter (Signed)
Pt scheduled for follow up

## 2016-12-28 NOTE — Telephone Encounter (Signed)
Patient called because he was seen at the [place] and they have told him he is a diabetic.  He is concerned about this as Dr. Jeananne Rama has never diagnosed him with this.  He is asking for a nurse to speak with him regarding this.  Thank You Santiago Glad

## 2016-12-28 NOTE — Telephone Encounter (Signed)
Patient need to move his colonoscopy into Feb. He is having trouble with his prostate.

## 2016-12-29 NOTE — Telephone Encounter (Signed)
Pt having medical issues and will call back after appt with PCP to reschedule.

## 2016-12-31 ENCOUNTER — Ambulatory Visit (INDEPENDENT_AMBULATORY_CARE_PROVIDER_SITE_OTHER): Payer: 59 | Admitting: Family Medicine

## 2016-12-31 ENCOUNTER — Encounter: Payer: Self-pay | Admitting: Family Medicine

## 2016-12-31 VITALS — BP 132/76 | HR 69 | Temp 98.2°F | Ht 70.0 in | Wt 196.0 lb

## 2016-12-31 DIAGNOSIS — N400 Enlarged prostate without lower urinary tract symptoms: Secondary | ICD-10-CM | POA: Insufficient documentation

## 2016-12-31 DIAGNOSIS — E119 Type 2 diabetes mellitus without complications: Secondary | ICD-10-CM | POA: Diagnosis not present

## 2016-12-31 DIAGNOSIS — R3914 Feeling of incomplete bladder emptying: Secondary | ICD-10-CM

## 2016-12-31 DIAGNOSIS — R7309 Other abnormal glucose: Secondary | ICD-10-CM

## 2016-12-31 DIAGNOSIS — N401 Enlarged prostate with lower urinary tract symptoms: Secondary | ICD-10-CM

## 2016-12-31 MED ORDER — TAMSULOSIN HCL 0.4 MG PO CAPS: 0.4000 mg | ORAL_CAPSULE | Freq: Every day | ORAL | 1 refills | Status: DC

## 2016-12-31 MED ORDER — METFORMIN HCL 500 MG PO TABS: 500.0000 mg | ORAL_TABLET | Freq: Every day | ORAL | 1 refills | Status: DC

## 2016-12-31 NOTE — Assessment & Plan Note (Signed)
Discuss new diagnosis of diabetes will start diabetes education class with referral and start metformin 500 mg 1 a day generally will take at bedtime or in the evening.

## 2016-12-31 NOTE — Assessment & Plan Note (Signed)
Patient with BPH diagnosed at the [place] with elevated PSA no medication was prescribed will start tamsulosin

## 2016-12-31 NOTE — Progress Notes (Signed)
BP 132/76 (BP Location: Left Arm)   Pulse 69   Temp 98.2 F (36.8 C) (Oral)   Ht 5\' 10"  (1.778 m)   Wt 196 lb (88.9 kg)   SpO2 94%   BMI 28.12 kg/m    Subjective:    Patient ID: William Sexton, male    DOB: [birth year], 61 y.o.   MRN: [id number]  HPI: William Sexton is a 61 y.o. male  Chief Complaint  Patient presents with  . Diabetes    6.7%   Patient with BPH diagnosed at the [place] with elevated PSA. Patient also is noted to have elevated glucose and a did a hemoglobin A1c which was 6.7. Review of glucose here patient's had some prediabetes changes with nonfasting glucose readings. Patient was discussed and had to do self-catheterization because of some urinary retention. Is not given any medications for either diabetes or BPH. Still having BPH symptoms with incomplete emptying. Relevant past medical, surgical, family and social history reviewed and updated as indicated. Interim medical history since our last visit reviewed. Allergies and medications reviewed and updated.  Review of Systems  Constitutional: Negative.   Respiratory: Negative.   Cardiovascular: Negative.     Per HPI unless specifically indicated above     Objective:    BP 132/76 (BP Location: Left Arm)   Pulse 69   Temp 98.2 F (36.8 C) (Oral)   Ht 5\' 10"  (1.778 m)   Wt 196 lb (88.9 kg)   SpO2 94%   BMI 28.12 kg/m   Wt Readings from Last 3 Encounters:  12/31/16 196 lb (88.9 kg)  11/03/16 193 lb (87.5 kg)  03/17/16 197 lb (89.4 kg)    Physical Exam  Constitutional: He is oriented to person, place, and time. He appears well-developed and well-nourished. No distress.  HENT:  Head: Normocephalic and atraumatic.  Right Ear: Hearing normal.  Left Ear: Hearing normal.  Nose: Nose normal.  Eyes: Conjunctivae and lids are normal. Right eye exhibits no discharge. Left eye exhibits no discharge. No scleral icterus.  Cardiovascular: Normal rate, regular rhythm and normal heart sounds.   Pulmonary/Chest:  Effort normal and breath sounds normal. No respiratory distress.  Musculoskeletal: Normal range of motion.  Neurological: He is alert and oriented to person, place, and time.  Skin: Skin is intact. No rash noted.  Psychiatric: He has a normal mood and affect. His speech is normal and behavior is normal. Judgment and thought content normal. Cognition and memory are normal.    Results for orders placed or performed in visit on 11/03/16  CBC with Differential/Platelet  Result Value Ref Range   WBC 5.1 3.4 - 10.8 x10E3/uL   RBC 4.92 4.14 - 5.80 x10E6/uL   Hemoglobin 15.5 12.6 - 17.7 g/dL   Hematocrit 44.7 37.5 - 51.0 %   MCV 91 79 - 97 fL   MCH 31.5 26.6 - 33.0 pg   MCHC 34.7 31.5 - 35.7 g/dL   RDW 13.1 12.3 - 15.4 %   Platelets 201 150 - 379 x10E3/uL   Neutrophils 50 Not Estab. %   Lymphs 34 Not Estab. %   Monocytes 14 Not Estab. %   Eos 2 Not Estab. %   Basos 0 Not Estab. %   Neutrophils Absolute 2.5 1.4 - 7.0 x10E3/uL   Lymphocytes Absolute 1.7 0.7 - 3.1 x10E3/uL   Monocytes Absolute 0.7 0.1 - 0.9 x10E3/uL   EOS (ABSOLUTE) 0.1 0.0 - 0.4 x10E3/uL   Basophils Absolute 0.0 0.0 - 0.2 x10E3/uL  Immature Granulocytes 0 Not Estab. %   Immature Grans (Abs) 0.0 0.0 - 0.1 x10E3/uL  Comprehensive metabolic panel  Result Value Ref Range   Glucose 130 (H) 65 - 99 mg/dL   BUN 12 6 - 24 mg/dL   Creatinine, Ser 0.92 0.76 - 1.27 mg/dL   GFR calc non Af Amer 91 >59 mL/min/1.73   GFR calc Af Amer 105 >59 mL/min/1.73   BUN/Creatinine Ratio 13 9 - 20   Sodium 140 134 - 144 mmol/L   Potassium 4.6 3.5 - 5.2 mmol/L   Chloride 97 96 - 106 mmol/L   CO2 26 18 - 29 mmol/L   Calcium 10.2 8.7 - 10.2 mg/dL   Total Protein 6.8 6.0 - 8.5 g/dL   Albumin 4.5 3.5 - 5.5 g/dL   Globulin, Total 2.3 1.5 - 4.5 g/dL   Albumin/Globulin Ratio 2.0 1.2 - 2.2   Bilirubin Total 0.7 0.0 - 1.2 mg/dL   Alkaline Phosphatase 65 39 - 117 IU/L   AST 25 0 - 40 IU/L   ALT 32 0 - 44 IU/L  Urinalysis, Routine w reflex  microscopic (not at [facility])  Result Value Ref Range   Specific Gravity, UA 1.015 1.005 - 1.030   pH, UA 7.0 5.0 - 7.5   Color, UA Yellow Yellow   Appearance Ur Clear Clear   Leukocytes, UA Negative Negative   Protein, UA Negative Negative/Trace   Glucose, UA Negative Negative   Ketones, UA Negative Negative   RBC, UA Negative Negative   Bilirubin, UA Negative Negative   Urobilinogen, Ur 0.2 0.2 - 1.0 mg/dL   Nitrite, UA Negative Negative  Lipid Profile  Result Value Ref Range   Cholesterol, Total 161 100 - 199 mg/dL   Triglycerides 115 0 - 149 mg/dL   HDL 48 >39 mg/dL   VLDL Cholesterol Cal 23 5 - 40 mg/dL   LDL Calculated 90 0 - 99 mg/dL   Chol/HDL Ratio 3.4 0.0 - 5.0 ratio units  TSH  Result Value Ref Range   TSH 0.997 0.450 - 4.500 uIU/mL  PSA  Result Value Ref Range   Prostate Specific Ag, Serum 7.4 (H) 0.0 - 4.0 ng/mL  Please Note  Result Value Ref Range   Please note Comment       Assessment & Plan:   Problem List Items Addressed This Visit      Endocrine   Diabetes mellitus type 2, controlled ([place])    Discuss new diagnosis of diabetes will start diabetes education class with referral and start metformin 500 mg 1 a day generally will take at bedtime or in the evening.      Relevant Medications   metFORMIN (GLUCOPHAGE) 500 MG tablet   Other Relevant Orders   Amb Referral to Nutrition and Diabetic E     Genitourinary   BPH (benign prostatic hyperplasia)    Patient with BPH diagnosed at the [place] with elevated PSA no medication was prescribed will start tamsulosin      Relevant Medications   tamsulosin (FLOMAX) 0.4 MG CAPS capsule    Other Visit Diagnoses    Elevated glucose level    -  Primary       Follow up plan: Return in about 2 months (around 02/28/2017) for Hemoglobin A1c.

## 2017-01-08 ENCOUNTER — Ambulatory Visit: Admit: 2017-01-08 | Payer: Self-pay | Admitting: Gastroenterology

## 2017-01-08 DIAGNOSIS — R3914 Feeling of incomplete bladder emptying: Secondary | ICD-10-CM | POA: Diagnosis not present

## 2017-01-08 DIAGNOSIS — R972 Elevated prostate specific antigen [PSA]: Secondary | ICD-10-CM | POA: Diagnosis not present

## 2017-01-08 SURGERY — COLONOSCOPY WITH PROPOFOL
Anesthesia: General

## 2017-01-22 DIAGNOSIS — R972 Elevated prostate specific antigen [PSA]: Secondary | ICD-10-CM | POA: Diagnosis not present

## 2017-01-22 DIAGNOSIS — C61 Malignant neoplasm of prostate: Secondary | ICD-10-CM | POA: Diagnosis not present

## 2017-01-22 DIAGNOSIS — R102 Pelvic and perineal pain: Secondary | ICD-10-CM | POA: Diagnosis not present

## 2017-01-26 DIAGNOSIS — R3914 Feeling of incomplete bladder emptying: Secondary | ICD-10-CM | POA: Diagnosis not present

## 2017-01-26 DIAGNOSIS — N401 Enlarged prostate with lower urinary tract symptoms: Secondary | ICD-10-CM | POA: Diagnosis not present

## 2017-01-26 DIAGNOSIS — R3129 Other microscopic hematuria: Secondary | ICD-10-CM | POA: Diagnosis not present

## 2017-01-26 DIAGNOSIS — R972 Elevated prostate specific antigen [PSA]: Secondary | ICD-10-CM | POA: Diagnosis not present

## 2017-02-02 DIAGNOSIS — C61 Malignant neoplasm of prostate: Secondary | ICD-10-CM | POA: Diagnosis not present

## 2017-02-02 DIAGNOSIS — R972 Elevated prostate specific antigen [PSA]: Secondary | ICD-10-CM | POA: Diagnosis not present

## 2017-02-02 DIAGNOSIS — D400 Neoplasm of uncertain behavior of prostate: Secondary | ICD-10-CM | POA: Diagnosis not present

## 2017-02-03 ENCOUNTER — Other Ambulatory Visit: Payer: Self-pay | Admitting: Urology

## 2017-02-03 DIAGNOSIS — C61 Malignant neoplasm of prostate: Secondary | ICD-10-CM

## 2017-02-16 ENCOUNTER — Ambulatory Visit: Admission: RE | Admit: 2017-02-16 | Payer: 59 | Source: Ambulatory Visit

## 2017-02-17 ENCOUNTER — Encounter (INDEPENDENT_AMBULATORY_CARE_PROVIDER_SITE_OTHER): Payer: Self-pay

## 2017-02-17 ENCOUNTER — Ambulatory Visit
Admission: RE | Admit: 2017-02-17 | Discharge: 2017-02-17 | Disposition: A | Discharge status: Home / Self Care | Payer: Commercial Managed Care - HMO | Source: Ambulatory Visit | Attending: Urology | Admitting: Urology

## 2017-02-17 DIAGNOSIS — K769 Liver disease, unspecified: Secondary | ICD-10-CM | POA: Insufficient documentation

## 2017-02-17 DIAGNOSIS — C61 Malignant neoplasm of prostate: Secondary | ICD-10-CM | POA: Diagnosis not present

## 2017-02-17 HISTORY — DX: Type 2 diabetes mellitus without complications: E11.9

## 2017-02-17 LAB — POCT I-STAT CREATININE: Creatinine, Ser: 0.8 mg/dL (ref 0.61–1.24)

## 2017-02-17 MED ORDER — IOPAMIDOL (ISOVUE-300) INJECTION 61%
100.0000 mL | Freq: Once | INTRAVENOUS | Status: AC | PRN
  Administered 2017-02-17: 100 mL via INTRAVENOUS

## 2017-02-19 DIAGNOSIS — D400 Neoplasm of uncertain behavior of prostate: Secondary | ICD-10-CM | POA: Diagnosis not present

## 2017-02-19 DIAGNOSIS — R972 Elevated prostate specific antigen [PSA]: Secondary | ICD-10-CM | POA: Diagnosis not present

## 2017-02-19 DIAGNOSIS — C61 Malignant neoplasm of prostate: Secondary | ICD-10-CM | POA: Diagnosis not present

## 2017-02-23 ENCOUNTER — Other Ambulatory Visit: Payer: Self-pay | Admitting: Urology

## 2017-02-23 DIAGNOSIS — C61 Malignant neoplasm of prostate: Secondary | ICD-10-CM

## 2017-03-02 ENCOUNTER — Encounter
Admission: RE | Admit: 2017-03-02 | Discharge: 2017-03-02 | Disposition: A | Discharge status: Home / Self Care | Payer: Commercial Managed Care - HMO | Source: Ambulatory Visit | Attending: Urology | Admitting: Urology

## 2017-03-02 ENCOUNTER — Ambulatory Visit: Payer: 59

## 2017-03-02 DIAGNOSIS — C61 Malignant neoplasm of prostate: Secondary | ICD-10-CM | POA: Diagnosis not present

## 2017-03-02 MED ORDER — TECHNETIUM TC 99M MEDRONATE IV KIT
25.0000 | PACK | Freq: Once | INTRAVENOUS | Status: AC | PRN
  Administered 2017-03-02: 24.19 via INTRAVENOUS

## 2017-03-04 DIAGNOSIS — R338 Other retention of urine: Secondary | ICD-10-CM | POA: Diagnosis not present

## 2017-03-04 DIAGNOSIS — R972 Elevated prostate specific antigen [PSA]: Secondary | ICD-10-CM | POA: Diagnosis not present

## 2017-03-04 DIAGNOSIS — C61 Malignant neoplasm of prostate: Secondary | ICD-10-CM | POA: Diagnosis not present

## 2017-04-05 ENCOUNTER — Ambulatory Visit: Payer: 59 | Admitting: Family Medicine

## 2017-05-03 ENCOUNTER — Ambulatory Visit: Payer: 59 | Admitting: Family Medicine

## 2017-05-10 ENCOUNTER — Ambulatory Visit: Payer: Self-pay | Admitting: Family Medicine

## 2017-05-26 HISTORY — PX: CERVICAL DISCECTOMY: SHX98

## 2017-06-01 DIAGNOSIS — C61 Malignant neoplasm of prostate: Secondary | ICD-10-CM | POA: Diagnosis not present

## 2017-06-01 DIAGNOSIS — R338 Other retention of urine: Secondary | ICD-10-CM | POA: Diagnosis not present

## 2017-06-01 DIAGNOSIS — D400 Neoplasm of uncertain behavior of prostate: Secondary | ICD-10-CM | POA: Diagnosis not present

## 2017-06-17 ENCOUNTER — Ambulatory Visit: Payer: 59 | Admitting: Family Medicine

## 2017-06-17 ENCOUNTER — Encounter: Payer: Self-pay | Admitting: Family Medicine

## 2017-06-17 ENCOUNTER — Ambulatory Visit (INDEPENDENT_AMBULATORY_CARE_PROVIDER_SITE_OTHER): Payer: 59 | Admitting: Family Medicine

## 2017-06-17 VITALS — BP 124/76 | HR 68 | Ht 69.75 in | Wt 176.0 lb

## 2017-06-17 DIAGNOSIS — F325 Major depressive disorder, single episode, in full remission: Secondary | ICD-10-CM | POA: Diagnosis not present

## 2017-06-17 DIAGNOSIS — G4730 Sleep apnea, unspecified: Secondary | ICD-10-CM

## 2017-06-17 DIAGNOSIS — E782 Mixed hyperlipidemia: Secondary | ICD-10-CM | POA: Diagnosis not present

## 2017-06-17 DIAGNOSIS — C61 Malignant neoplasm of prostate: Secondary | ICD-10-CM | POA: Diagnosis not present

## 2017-06-17 DIAGNOSIS — E119 Type 2 diabetes mellitus without complications: Secondary | ICD-10-CM

## 2017-06-17 DIAGNOSIS — M4802 Spinal stenosis, cervical region: Secondary | ICD-10-CM | POA: Diagnosis not present

## 2017-06-17 LAB — BAYER DCA HB A1C WAIVED: HB A1C (BAYER DCA - WAIVED): 6 % (ref <7.0)

## 2017-06-17 NOTE — Progress Notes (Signed)
BP 124/76   Pulse 68   Ht 5' 9.75" (1.772 m)   Wt 176 lb (79.8 kg)   SpO2 98%   BMI 25.43 kg/m    Subjective:    Patient ID: William Sexton, male    DOB: [birth year], 61 y.o.   MRN: [id number]  HPI: William Sexton is a 61 y.o. male  Chief Complaint  Patient presents with  . Follow-up   Patient follow-ups had a stormy summer had problems with spinal stenosis and had spinal stenosis surgery earlier this month on the cervical spine. Radicular symptoms and leg have resolved. Patient also diagnosed with prostate cancer by biopsy is being observed by watchful waiting and observation at this point. Patient doing well with other medications Patient taking citalopram without problems interested in getting off. Is out of work and actually doing well. Has done well for some time. Patient using CPAP machine but having difficulty using it and getting used to it. Relevant past medical, surgical, family and social history reviewed and updated as indicated. Interim medical history since our last visit reviewed. Allergies and medications reviewed and updated.  Review of Systems  Constitutional: Negative.   Respiratory: Negative.   Cardiovascular: Negative.     Per HPI unless specifically indicated above     Objective:    BP 124/76   Pulse 68   Ht 5' 9.75" (1.772 m)   Wt 176 lb (79.8 kg)   SpO2 98%   BMI 25.43 kg/m   Wt Readings from Last 3 Encounters:  06/17/17 176 lb (79.8 kg)  12/31/16 196 lb (88.9 kg)  11/03/16 193 lb (87.5 kg)    Physical Exam  Constitutional: He is oriented to person, place, and time. He appears well-developed and well-nourished.  HENT:  Head: Normocephalic and atraumatic.  Eyes: Conjunctivae and EOM are normal.  Neck: Normal range of motion.  Cardiovascular: Normal rate, regular rhythm and normal heart sounds.   Pulmonary/Chest: Effort normal and breath sounds normal.  Musculoskeletal: Normal range of motion.  Neurological: He is alert and oriented to  person, place, and time.  Skin: No erythema.  Psychiatric: He has a normal mood and affect. His behavior is normal. Judgment and thought content normal.    Results for orders placed or performed during the hospital encounter of 02/17/17  I-STAT creatinine  Result Value Ref Range   Creatinine, Ser 0.80 0.61 - 1.24 mg/dL      Assessment & Plan:   Problem List Items Addressed This Visit      Respiratory   Sleep apnea    Discuss using CPAP and break-in period.        Endocrine   Diabetes mellitus type 2, controlled ([place]) - Primary    Discussed diet controlled diabetes patient's had great weight loss will continue good control and good self-care.      Relevant Orders   Bayer DCA Hb A1c Waived     Genitourinary   Prostate cancer ([place])    Diagnosis by prostate biopsies and at this point no treatment except watchful waiting.        Other   Depression    Discussed depression care and treatment and tapering medications patient will go to 20 mg for couple weeks then 10 mg and then see about stopping.      Hyperlipidemia   Relevant Orders   Bayer DCA Hb A1c Waived   Cervical spinal stenosis       Follow up plan: Return in about 6 months (around  12/17/2017) for Physical Exam.

## 2017-06-17 NOTE — Assessment & Plan Note (Signed)
Discussed diet controlled diabetes patient's had great weight loss will continue good control and good self-care.

## 2017-06-17 NOTE — Assessment & Plan Note (Signed)
Discussed depression care and treatment and tapering medications patient will go to 20 mg for couple weeks then 10 mg and then see about stopping.

## 2017-06-17 NOTE — Assessment & Plan Note (Signed)
Diagnosis by prostate biopsies and at this point no treatment except watchful waiting.

## 2017-06-17 NOTE — Assessment & Plan Note (Signed)
Discuss using CPAP and break-in period.

## 2017-06-23 DIAGNOSIS — Z9889 Other specified postprocedural states: Secondary | ICD-10-CM | POA: Insufficient documentation

## 2017-06-28 DIAGNOSIS — D400 Neoplasm of uncertain behavior of prostate: Secondary | ICD-10-CM | POA: Diagnosis not present

## 2017-06-28 DIAGNOSIS — R3914 Feeling of incomplete bladder emptying: Secondary | ICD-10-CM | POA: Diagnosis not present

## 2017-06-28 DIAGNOSIS — C61 Malignant neoplasm of prostate: Secondary | ICD-10-CM | POA: Diagnosis not present

## 2017-07-01 ENCOUNTER — Telehealth: Payer: Self-pay | Admitting: Family Medicine

## 2017-07-01 NOTE — Telephone Encounter (Signed)
Per pt instructions from last OV, " Depression     Discussed depression care and treatment and tapering medications patient will go to 20 mg for couple weeks then 10 mg and then see about stopping.    "

## 2017-07-01 NOTE — Telephone Encounter (Signed)
Patient would like to speak with [facility] regarding the titration down on his citalopram.  He is down to the 20mg  for two weeks but needs to know where to go from this point.  Thanks  [phone]

## 2017-07-01 NOTE — Telephone Encounter (Signed)
Message relayed to patient. Verbalized understanding and denied questions.   

## 2017-07-06 DIAGNOSIS — C61 Malignant neoplasm of prostate: Secondary | ICD-10-CM | POA: Diagnosis not present

## 2017-07-13 DIAGNOSIS — C61 Malignant neoplasm of prostate: Secondary | ICD-10-CM | POA: Diagnosis not present

## 2017-07-14 DIAGNOSIS — R3914 Feeling of incomplete bladder emptying: Secondary | ICD-10-CM | POA: Diagnosis not present

## 2017-07-14 DIAGNOSIS — D400 Neoplasm of uncertain behavior of prostate: Secondary | ICD-10-CM | POA: Diagnosis not present

## 2017-07-14 DIAGNOSIS — C61 Malignant neoplasm of prostate: Secondary | ICD-10-CM | POA: Diagnosis not present

## 2017-07-27 DIAGNOSIS — C61 Malignant neoplasm of prostate: Secondary | ICD-10-CM | POA: Diagnosis not present

## 2017-08-02 ENCOUNTER — Other Ambulatory Visit: Payer: Self-pay | Admitting: Family Medicine

## 2017-08-02 DIAGNOSIS — R3914 Feeling of incomplete bladder emptying: Secondary | ICD-10-CM

## 2017-08-02 DIAGNOSIS — N401 Enlarged prostate with lower urinary tract symptoms: Secondary | ICD-10-CM

## 2017-08-02 DIAGNOSIS — I10 Essential (primary) hypertension: Secondary | ICD-10-CM

## 2017-08-02 MED ORDER — LISINOPRIL 20 MG PO TABS: 20.0000 mg | ORAL_TABLET | Freq: Every day | ORAL | 12 refills | Status: DC

## 2017-08-02 MED ORDER — TAMSULOSIN HCL 0.4 MG PO CAPS: 0.4000 mg | ORAL_CAPSULE | Freq: Every day | ORAL | 1 refills | Status: AC

## 2017-08-02 NOTE — Telephone Encounter (Signed)
Pt would like lisinopril and tamsulosin sent to [facility].

## 2017-08-05 DIAGNOSIS — C61 Malignant neoplasm of prostate: Secondary | ICD-10-CM | POA: Diagnosis not present

## 2017-08-17 DIAGNOSIS — R3989 Other symptoms and signs involving the genitourinary system: Secondary | ICD-10-CM | POA: Diagnosis not present

## 2017-08-17 DIAGNOSIS — I10 Essential (primary) hypertension: Secondary | ICD-10-CM | POA: Diagnosis not present

## 2017-08-17 DIAGNOSIS — I700 Atherosclerosis of aorta: Secondary | ICD-10-CM | POA: Diagnosis not present

## 2017-08-17 DIAGNOSIS — C61 Malignant neoplasm of prostate: Secondary | ICD-10-CM | POA: Diagnosis not present

## 2017-08-17 DIAGNOSIS — D400 Neoplasm of uncertain behavior of prostate: Secondary | ICD-10-CM | POA: Diagnosis not present

## 2017-08-17 DIAGNOSIS — R972 Elevated prostate specific antigen [PSA]: Secondary | ICD-10-CM | POA: Diagnosis not present

## 2017-08-17 DIAGNOSIS — E119 Type 2 diabetes mellitus without complications: Secondary | ICD-10-CM | POA: Diagnosis not present

## 2017-08-20 ENCOUNTER — Telehealth: Payer: Self-pay | Admitting: Family Medicine

## 2017-08-20 MED ORDER — CITALOPRAM HYDROBROMIDE 20 MG PO TABS: ORAL_TABLET | ORAL | 1 refills | Status: DC

## 2017-08-20 NOTE — Telephone Encounter (Signed)
Patient was tapered off citalopram per his request and has been off it for 3 weeks but he says he needs to go back on the citalopram 20mg . He states he is very impatient and irritable without the med.  He is hoping someone would be able to send a script in for this to [place]  If any problems he can be reached at [phone]

## 2017-08-20 NOTE — Telephone Encounter (Signed)
Rx sent to his pharmacy. Please have him follow up with PCP in 1 month to see if it's working

## 2017-08-20 NOTE — Telephone Encounter (Signed)
Patient called again to see if someone would send a script for him. I explained that it was now in the providers hands.  Just FYI

## 2017-08-20 NOTE — Telephone Encounter (Signed)
Spoke with patient to give him the information regarding his script. He also scheduled follow up with Dr Jeananne Rama in Oct.  Just FYI

## 2017-08-20 NOTE — Telephone Encounter (Signed)
Patient states that he has been off of it for like 3 weeks, he started tapering off in July. He states that he just is very impatient, irritable and he does not think that he can go without it, really anxious Celexa: 20mg   [name]

## 2017-08-20 NOTE — Telephone Encounter (Signed)
Per my review of his chart, we thought that he was still on it. How long has he been off of it?

## 2017-08-20 NOTE — Telephone Encounter (Signed)
Routing to provider  

## 2017-08-20 NOTE — Telephone Encounter (Signed)
Patient notified

## 2017-08-25 DIAGNOSIS — C61 Malignant neoplasm of prostate: Secondary | ICD-10-CM | POA: Diagnosis not present

## 2017-08-25 DIAGNOSIS — I10 Essential (primary) hypertension: Secondary | ICD-10-CM | POA: Diagnosis not present

## 2017-08-25 DIAGNOSIS — E119 Type 2 diabetes mellitus without complications: Secondary | ICD-10-CM | POA: Diagnosis not present

## 2017-08-25 DIAGNOSIS — D400 Neoplasm of uncertain behavior of prostate: Secondary | ICD-10-CM | POA: Diagnosis not present

## 2017-09-28 ENCOUNTER — Ambulatory Visit (INDEPENDENT_AMBULATORY_CARE_PROVIDER_SITE_OTHER): Payer: 59 | Admitting: Family Medicine

## 2017-09-28 ENCOUNTER — Encounter: Payer: Self-pay | Admitting: Family Medicine

## 2017-09-28 DIAGNOSIS — I10 Essential (primary) hypertension: Secondary | ICD-10-CM | POA: Diagnosis not present

## 2017-09-28 DIAGNOSIS — F3342 Major depressive disorder, recurrent, in full remission: Secondary | ICD-10-CM

## 2017-09-28 MED ORDER — CITALOPRAM HYDROBROMIDE 20 MG PO TABS: ORAL_TABLET | ORAL | 4 refills | Status: DC

## 2017-09-28 NOTE — Assessment & Plan Note (Signed)
The current medical regimen is effective;  continue present plan and medications.  

## 2017-09-28 NOTE — Progress Notes (Signed)
   BP 110/70   Pulse 66   Wt 182 lb (82.6 kg)   SpO2 97%   BMI 26.30 kg/m    Subjective:    Patient ID: William Sexton, male    DOB: 1956-03-10, 61 y.o.   MRN: [id number]  HPI: William Sexton is a 61 y.o. male  Patient tried getting off citalopram for 3 weeks and just was too irritable as gone back on it and is already doing better. Reviewed patient's been on citalopram long-term 10+ years and will probably consider stopping again in 10+ years.  For prostate cancer patient has radioactive seeds placed in August.   Relevant past medical, surgical, family and social history reviewed and updated as indicated. Interim medical history since our last visit reviewed. Allergies and medications reviewed and updated.  Review of Systems  Constitutional: Negative.   Respiratory: Negative.   Cardiovascular: Negative.     Per HPI unless specifically indicated above     Objective:    BP 110/70   Pulse 66   Wt 182 lb (82.6 kg)   SpO2 97%   BMI 26.30 kg/m   Wt Readings from Last 3 Encounters:  09/28/17 182 lb (82.6 kg)  06/17/17 176 lb (79.8 kg)  12/31/16 196 lb (88.9 kg)    Physical Exam  Constitutional: He is oriented to person, place, and time. He appears well-developed and well-nourished.  HENT:  Head: Normocephalic and atraumatic.  Eyes: Conjunctivae and EOM are normal.  Neck: Normal range of motion.  Cardiovascular: Normal rate, regular rhythm and normal heart sounds.   Pulmonary/Chest: Effort normal and breath sounds normal.  Musculoskeletal: Normal range of motion.  Neurological: He is alert and oriented to person, place, and time.  Skin: No erythema.  Psychiatric: He has a normal mood and affect. His behavior is normal. Judgment and thought content normal.    Results for orders placed or performed in visit on 06/17/17  Bayer DCA Hb A1c Waived  Result Value Ref Range   Bayer DCA Hb A1c Waived 6.0 <7.0 %      Assessment & Plan:   Problem List Items Addressed This  Visit      Cardiovascular and Mediastinum   Hypertension    The current medical regimen is effective;  continue present plan and medications.         Other   Depression    Patient follow-up depression discussed citalopram use patient will take long-term prescription.      Relevant Medications   citalopram (CELEXA) 20 MG tablet       Follow up plan: Return in about 4 weeks (around 10/26/2017).

## 2017-09-28 NOTE — Assessment & Plan Note (Signed)
Patient follow-up depression discussed citalopram use patient will take long-term prescription.

## 2017-10-06 DIAGNOSIS — Z23 Encounter for immunization: Secondary | ICD-10-CM | POA: Diagnosis not present

## 2017-10-20 ENCOUNTER — Encounter: Payer: Self-pay | Admitting: Urology

## 2017-11-04 ENCOUNTER — Encounter: Payer: 59 | Admitting: Family Medicine

## 2017-11-05 ENCOUNTER — Encounter: Payer: 59 | Admitting: Family Medicine

## 2017-11-18 DIAGNOSIS — R972 Elevated prostate specific antigen [PSA]: Secondary | ICD-10-CM | POA: Diagnosis not present

## 2017-11-18 DIAGNOSIS — D400 Neoplasm of uncertain behavior of prostate: Secondary | ICD-10-CM | POA: Diagnosis not present

## 2017-11-18 DIAGNOSIS — M5416 Radiculopathy, lumbar region: Secondary | ICD-10-CM | POA: Insufficient documentation

## 2017-11-18 DIAGNOSIS — C61 Malignant neoplasm of prostate: Secondary | ICD-10-CM | POA: Diagnosis not present

## 2018-03-29 DIAGNOSIS — D229 Melanocytic nevi, unspecified: Secondary | ICD-10-CM | POA: Diagnosis not present

## 2018-03-29 DIAGNOSIS — L218 Other seborrheic dermatitis: Secondary | ICD-10-CM | POA: Diagnosis not present

## 2018-03-29 DIAGNOSIS — Z85820 Personal history of malignant melanoma of skin: Secondary | ICD-10-CM | POA: Diagnosis not present

## 2018-05-03 DIAGNOSIS — C61 Malignant neoplasm of prostate: Secondary | ICD-10-CM | POA: Diagnosis not present

## 2018-05-03 DIAGNOSIS — D400 Neoplasm of uncertain behavior of prostate: Secondary | ICD-10-CM | POA: Diagnosis not present

## 2018-05-24 ENCOUNTER — Encounter: Payer: Self-pay | Admitting: Family Medicine

## 2018-05-24 ENCOUNTER — Ambulatory Visit (INDEPENDENT_AMBULATORY_CARE_PROVIDER_SITE_OTHER): Payer: 59 | Admitting: Family Medicine

## 2018-05-24 VITALS — BP 119/74 | HR 57 | Ht 70.0 in | Wt 185.0 lb

## 2018-05-24 DIAGNOSIS — I10 Essential (primary) hypertension: Secondary | ICD-10-CM

## 2018-05-25 ENCOUNTER — Encounter: Payer: Self-pay | Admitting: Family Medicine

## 2018-05-25 NOTE — Progress Notes (Signed)
No office visit patient left without being seen.

## 2018-07-14 DIAGNOSIS — L218 Other seborrheic dermatitis: Secondary | ICD-10-CM | POA: Diagnosis not present

## 2018-07-14 DIAGNOSIS — L570 Actinic keratosis: Secondary | ICD-10-CM | POA: Diagnosis not present

## 2018-07-26 ENCOUNTER — Ambulatory Visit: Payer: 59 | Admitting: Family Medicine

## 2018-09-13 IMAGING — CT CT ABD-PEL WO/W CM
2 of 5 series · 12 of 32 positions shown, 18 images · IV contrast (iopamidol)
Comparison: None.

CLINICAL DATA: Newly diagnosed prostate cancer.

EXAM:
CT ABDOMEN AND PELVIS WITHOUT AND WITH CONTRAST
TECHNIQUE: Multidetector CT imaging of the abdomen and pelvis was performed
following the standard protocol before and following the bolus
administration of intravenous contrast.
CONTRAST:  100mL 2H1HTE-W44 IOPAMIDOL (2H1HTE-W44) INJECTION 61%

[Series 2: axial st · axial · 0.74mm/px · z∈[-955,-555]mm · 9 of 102 slices shown, 15 images (1 of 2)]
[im 11/102  soft-tissue]
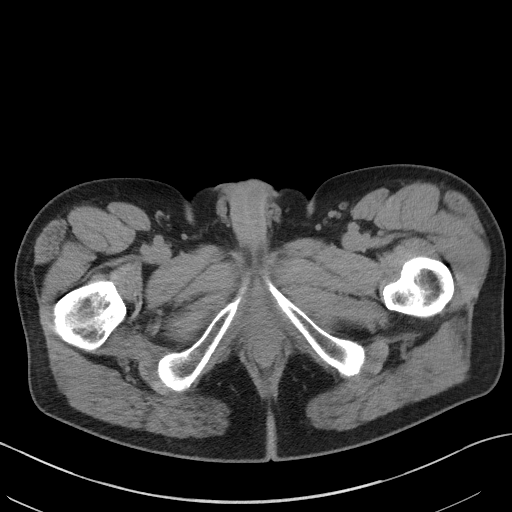
[im 11/102  bone]
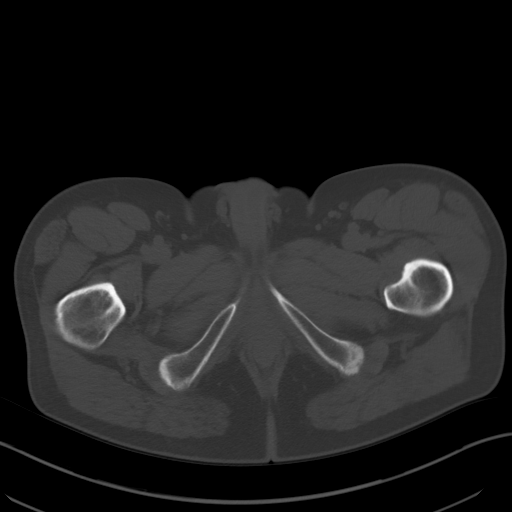
[im 21/102  soft-tissue]
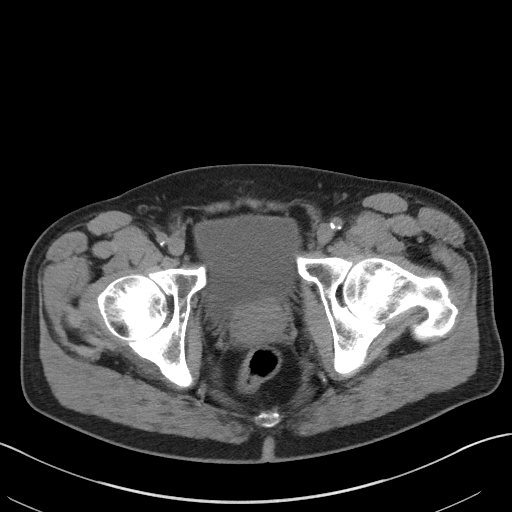
[im 31/102  soft-tissue]
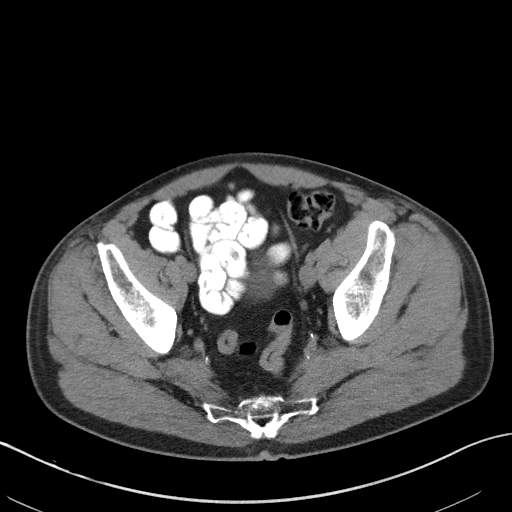
[im 41/102  soft-tissue]
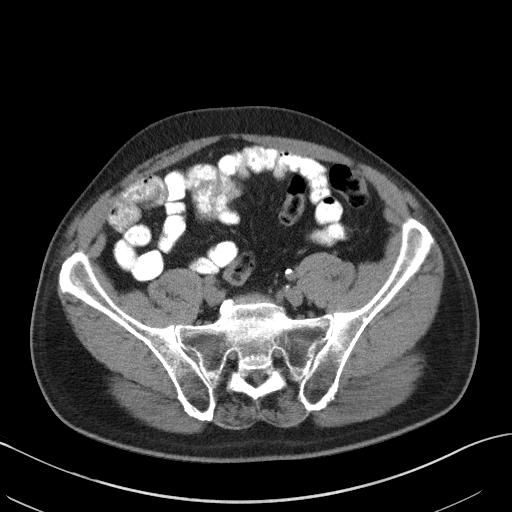
[im 51/102  soft-tissue]
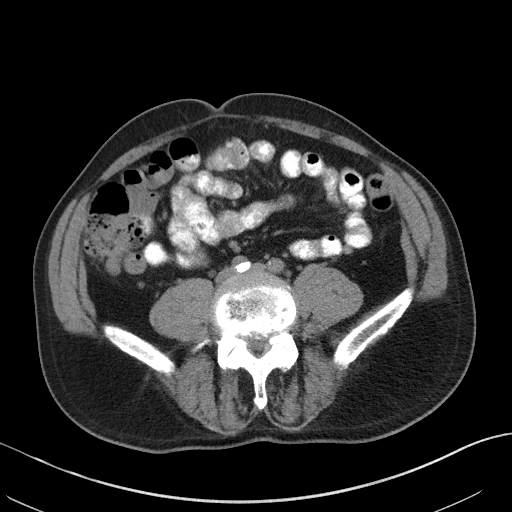
[im 61/102  soft-tissue]
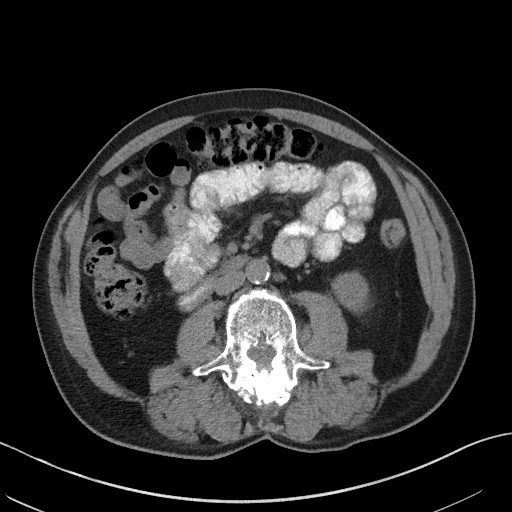
[im 61/102  lung]
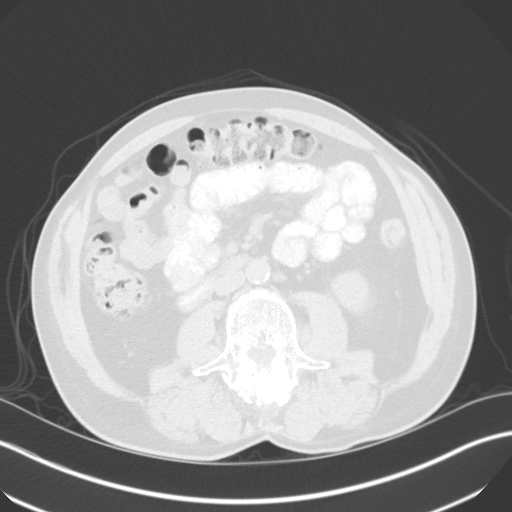
[im 71/102  soft-tissue]
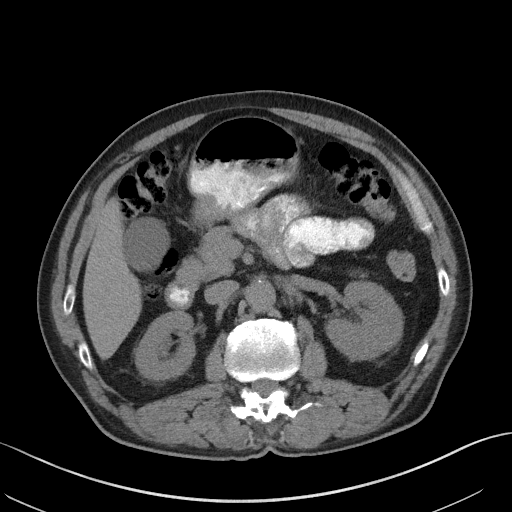
[im 71/102  lung]
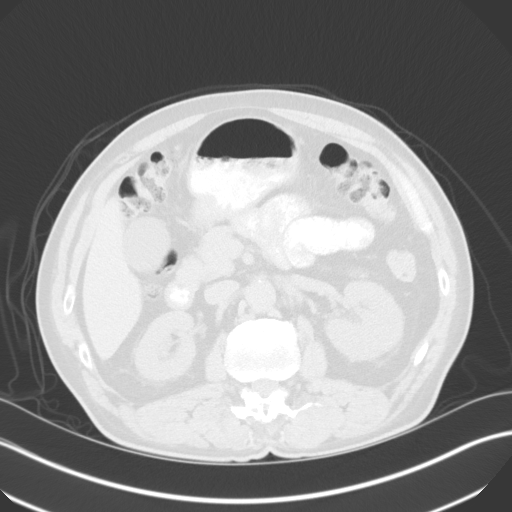
[im 81/102  soft-tissue]
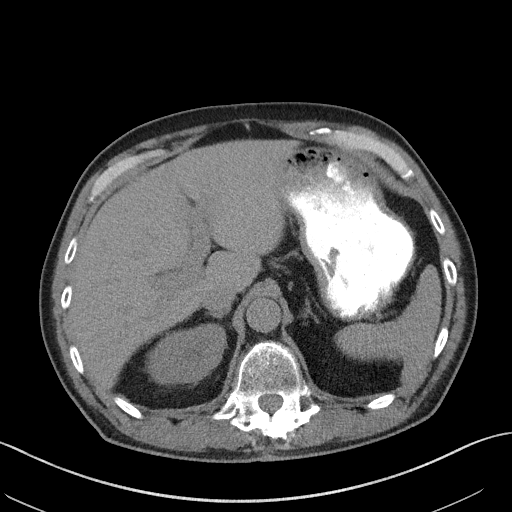
[im 81/102  lung]
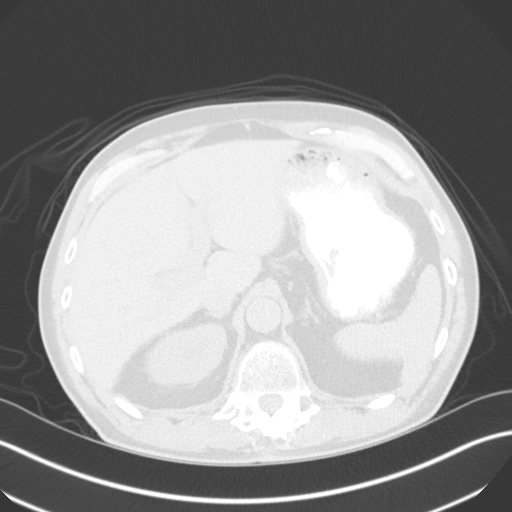
[im 91/102  soft-tissue]
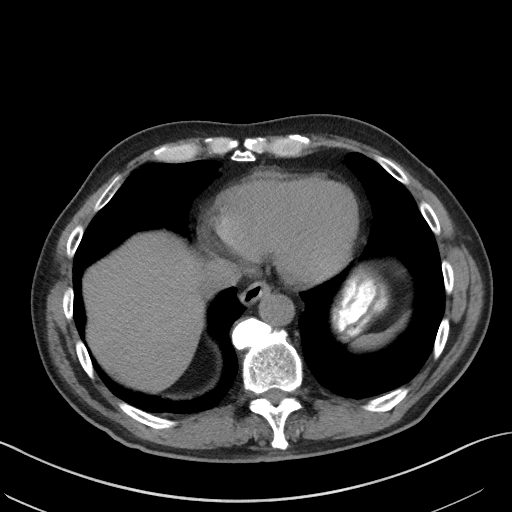
[im 91/102  lung]
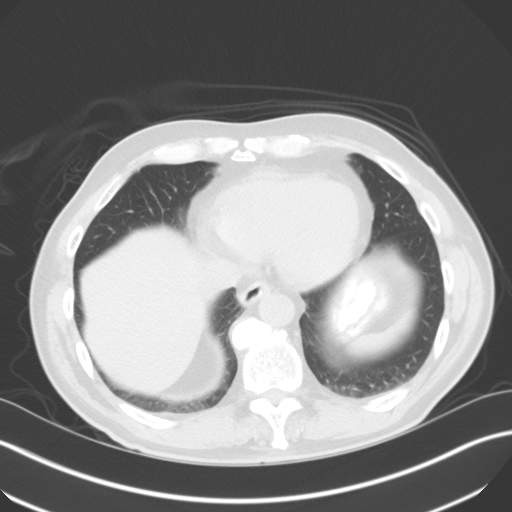
[im 91/102  bone]
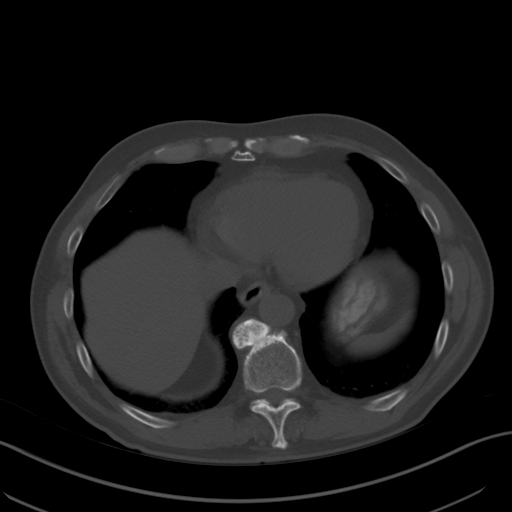

[Series 4: axial st · axial · 0.75mm/px · z∈[-955,-855]mm · 3 of 102 slices shown (2 of 2)]
[im 11/102  soft-tissue]
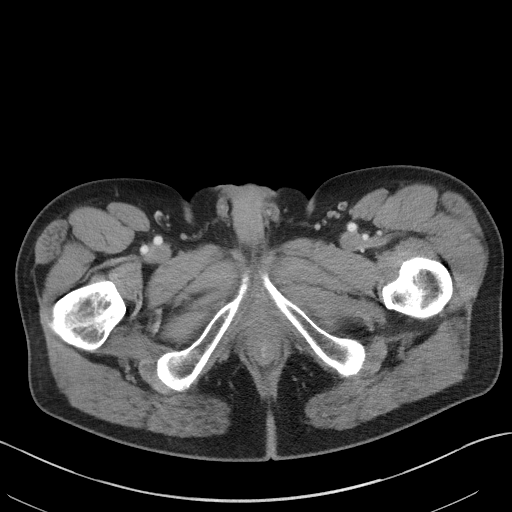
[im 21/102  soft-tissue]
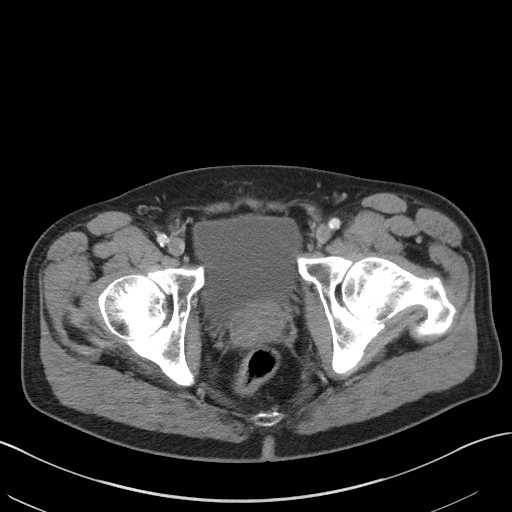
[im 31/102  soft-tissue]
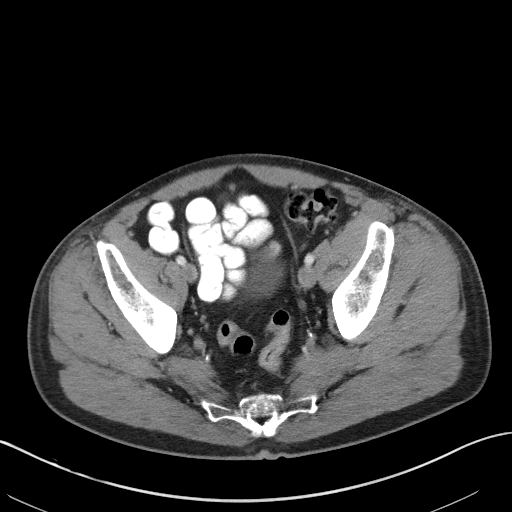

[12 of 32 positions shown; findings below may reference images not displayed]

FINDINGS: Lower chest:  Unremarkable.

Hepatobiliary: Tiny 8 mm hypervascular lesion identified inferior
right liver (image 34 series 4). Tiny hypodense liver lesions seen
on image 26, posterior right liver There is no evidence for
gallstones, gallbladder wall thickening, or pericholecystic fluid.
No intrahepatic or extrahepatic biliary dilation.

Pancreas: No focal mass lesion. No dilatation of the main duct. No
intraparenchymal cyst. No peripancreatic edema.

Spleen: No splenomegaly. No focal mass lesion.

Adrenals/Urinary Tract: No adrenal nodule or mass.

Precontrast imaging shows no stone disease in either kidney or
ureter. No bladder stones. 16 mm cyst identified interpolar right
kidney. No evidence for an enhancing lesion in either kidney.

Delayed imaging shows normal appearance of the intrarenal collecting
system and renal pelvis of each kidney. No evidence for hydroureter.
The urinary bladder appears normal for the degree of distention.

Stomach/Bowel: Stomach is nondistended. No gastric wall thickening.
No evidence of outlet obstruction. Duodenum is normally positioned
as is the ligament of Treitz. The terminal ileum is normal. The
appendix is normal. No gross colonic mass. No colonic wall
thickening. No substantial diverticular change.

Vascular/Lymphatic: There is abdominal aortic atherosclerosis
without aneurysm. There is no gastrohepatic or hepatoduodenal
ligament lymphadenopathy. No intraperitoneal or retroperitoneal
lymphadenopathy. No pelvic sidewall lymphadenopathy.

Reproductive: Prostate gland appears mildly enlarged. Slight
asymmetry of the seminal vesicles with right appearing slightly
larger than left. No grossly abnormal soft tissue in the para
prostatic region.

Other: No intraperitoneal free fluid.

Musculoskeletal: Bone windows reveal no worrisome lytic or sclerotic
osseous lesions.
IMPRESSION: 1. No specific features to suggest metastatic disease in the abdomen
or pelvis.
2. 8 mm hypervascular lesion in the inferior right liver is most
likely benign as this would be an atypical presentation for
metastatic prostate cancer in the absence of other disease.
Follow-up MRI in 3-6 months could be used to ensure stability.

## 2018-10-06 ENCOUNTER — Ambulatory Visit (INDEPENDENT_AMBULATORY_CARE_PROVIDER_SITE_OTHER): Payer: 59 | Admitting: Family Medicine

## 2018-10-06 ENCOUNTER — Encounter: Payer: Self-pay | Admitting: Family Medicine

## 2018-10-06 VITALS — BP 134/72 | HR 58 | Temp 98.0°F | Ht 70.0 in | Wt 183.0 lb

## 2018-10-06 DIAGNOSIS — Z114 Encounter for screening for human immunodeficiency virus [HIV]: Secondary | ICD-10-CM

## 2018-10-06 DIAGNOSIS — J309 Allergic rhinitis, unspecified: Secondary | ICD-10-CM

## 2018-10-06 DIAGNOSIS — E7800 Pure hypercholesterolemia, unspecified: Secondary | ICD-10-CM

## 2018-10-06 DIAGNOSIS — Z23 Encounter for immunization: Secondary | ICD-10-CM | POA: Diagnosis not present

## 2018-10-06 DIAGNOSIS — Z1159 Encounter for screening for other viral diseases: Secondary | ICD-10-CM | POA: Diagnosis not present

## 2018-10-06 DIAGNOSIS — Z0000 Encounter for general adult medical examination without abnormal findings: Secondary | ICD-10-CM | POA: Diagnosis not present

## 2018-10-06 DIAGNOSIS — E119 Type 2 diabetes mellitus without complications: Secondary | ICD-10-CM

## 2018-10-06 DIAGNOSIS — Z1211 Encounter for screening for malignant neoplasm of colon: Secondary | ICD-10-CM | POA: Diagnosis not present

## 2018-10-06 DIAGNOSIS — I10 Essential (primary) hypertension: Secondary | ICD-10-CM

## 2018-10-06 DIAGNOSIS — F3342 Major depressive disorder, recurrent, in full remission: Secondary | ICD-10-CM

## 2018-10-06 DIAGNOSIS — E049 Nontoxic goiter, unspecified: Secondary | ICD-10-CM

## 2018-10-06 DIAGNOSIS — C61 Malignant neoplasm of prostate: Secondary | ICD-10-CM

## 2018-10-06 DIAGNOSIS — G4733 Obstructive sleep apnea (adult) (pediatric): Secondary | ICD-10-CM

## 2018-10-06 MED ORDER — LISINOPRIL 20 MG PO TABS
20.0000 mg | ORAL_TABLET | Freq: Every day | ORAL | 12 refills | Status: DC
Start: 2018-10-06 — End: 2020-03-18

## 2018-10-06 MED ORDER — ATORVASTATIN CALCIUM 40 MG PO TABS: 40.0000 mg | ORAL_TABLET | Freq: Every day | ORAL | 12 refills | Status: DC

## 2018-10-06 MED ORDER — CITALOPRAM HYDROBROMIDE 20 MG PO TABS: 20.0000 mg | ORAL_TABLET | Freq: Every day | ORAL | 4 refills | Status: DC

## 2018-10-06 NOTE — Progress Notes (Signed)
Patient: William Sexton, Male    DOB: 08-06-1956, 62 y.o.   MRN: [id number] Visit Date: 10/07/2018  Today's Provider: Lavon Paganini, MD   Chief Complaint  Patient presents with  . Establish Care   Subjective:  I, William Sexton, CMA, am acting as a scribe for Lavon Paganini, MD.    New Patient:  William Sexton is a 62 year old male who presents today to [facility] as a new patient. Patient was previously a patient at [facility].  He states that he is overdue for a physical and would like to have one today.  Patient is concerned about sore throat, plugged ear feeling, postnasal drip, nasal congestion this been ongoing for a few weeks.  He denies any chest pain, shortness of breath, wheezing, cough, sputum production.  HTN: - Medications: lisinopril 10 mg daily - medication was decreased because he was having dizzy spells a few months ago from 20 mg daily - Compliance: Good - Checking BP at home: Occasionally and has noticed that it is increasing recently to the 710G and 269 systolic consistently - Denies any SOB, CP, vision changes, LE edema, medication SEs, or symptoms of hypotension - Diet: Low-sodium - Exercise: None regularly  T2DM - Checking BG at home: no - Medications: was previously on Metformin, but stopped it when he was getting a lot of scans for prostate cancer - Compliance: n/a - Diet: Low-carb - Exercise: None regularly - eye exam: Needs a referral to an ophthalmologist - foot exam: Needs - microalbumin: N/A, on lisinopril - denies symptoms of hypoglycemia, polyuria, polydipsia, numbness extremities, foot ulcers/trauma  HLD - medications: lipitor 40 mg daily - compliance: Good - medication SEs: None, denies myalgias  Goiter: Patient is followed every 6 months by the [place].  He gets repeat ultrasounds and blood work to check his TSH.  OSA: Tried CPAP a few times and then wakes up and feels like he is sufficating.  It is waking in the  morning with headaches.  Wonders if this may be related to his blood pressure elevation as well.  He is discussed with previous PCP and knows that this is not good for his health to not use CPAP and how uncontrolled sleep apnea.  Patient has history of prostate cancer that was diagnosed about 1 year ago.  He was treated by Dr. Eliberto Ivory with radioactive seeds.  He is getting rechecked every 6 months.  His last PSA was significantly reduced.  He is taking Flomax 0.4 mg daily for BPH symptoms.  Patient has been diagnosed with spinal stenosis.  He has had previous spinal surgeries.  He is taking naproxen as needed, which is rarely.  He is followed by spine clinic in [facility], to which she was referred by the [place].  Patient has never had hepatitis C screening or colonoscopy. -----------------------------------------------------------------   Review of Systems  Constitutional: Negative.   HENT: Negative.   Eyes: Negative.   Respiratory: Negative.   Cardiovascular: Negative.   Gastrointestinal: Negative.   Endocrine: Negative.   Genitourinary: Negative.   Musculoskeletal: Positive for back pain, gait problem, myalgias, neck pain and neck stiffness.  Skin: Negative.   Allergic/Immunologic: Positive for environmental allergies.  Neurological: Positive for headaches.  Hematological: Negative.   Psychiatric/Behavioral: Negative.     Social History      He  reports that he quit smoking about 7 years ago. His smoking use included cigarettes. He has a 40.00 pack-year smoking history. He  has never used smokeless tobacco. He reports that he does not drink alcohol or use drugs.       Social History   Socioeconomic History  . Marital status: Single    Spouse name: Not on file  . Number of children: Not on file  . Years of education: Not on file  . Highest education level: Not on file  Occupational History  . Not on file  Social Needs  . Financial resource strain: Not on file  . Food  insecurity:    Worry: Not on file    Inability: Not on file  . Transportation needs:    Medical: Not on file    Non-medical: Not on file  Tobacco Use  . Smoking status: Former Smoker    Packs/day: 1.00    Years: 40.00    Pack years: 40.00    Types: Cigarettes    Last attempt to quit: 08/08/2011    Years since quitting: 7.1  . Smokeless tobacco: Never Used  Substance and Sexual Activity  . Alcohol use: No    Alcohol/week: 0.0 standard drinks  . Drug use: No  . Sexual activity: Not on file  Lifestyle  . Physical activity:    Days per week: Not on file    Minutes per session: Not on file  . Stress: Not on file  Relationships  . Social connections:    Talks on phone: Not on file    Gets together: Not on file    Attends religious service: Not on file    Active member of club or organization: Not on file    Attends meetings of clubs or organizations: Not on file    Relationship status: Not on file  Other Topics Concern  . Not on file  Social History Narrative  . Not on file    Past Medical History:  Diagnosis Date  . Allergy   . Anxiety   . Arthritis   . Cerebral concussion   . Chronic back pain   . Depression   . Diabetes mellitus without complication ([place])    Pt takes Metformin.  Marland Kitchen Hyperlipidemia   . Hypertension   . Prostate cancer ([employer])   . Sleep apnea   . Spinal stenosis   . Thyroid goiter      Patient Active Problem List   Diagnosis Date Noted  . Allergic rhinitis 10/06/2018  . Radiculopathy of lumbar region 11/18/2017  . Hx of excision of lamina of cervical vertebra for decompression of spinal cord 06/23/2017  . Prostate cancer ([place]) 06/17/2017  . Cervical spinal stenosis 06/17/2017  . BPH (benign prostatic hyperplasia) 12/31/2016  . Diabetes mellitus type 2, controlled ([place]) 12/31/2016  . Sleep apnea 11/03/2016  . Thyroid goiter 11/03/2016  . Depression   . Hyperlipidemia   . Hypertension   . Enlarged prostate without lower urinary tract  symptoms (luts) 05/17/2015    Past Surgical History:  Procedure Laterality Date  . CERVICAL DISCECTOMY  05/26/2017  . FRACTURE SURGERY    . L1-2 Fusion    . PROSTATE SURGERY    . [place] History        Family Status  Relation Name Status  . Father  Deceased  . Mother  Alive  . Sister  Deceased  . MGM  Deceased  . MGF  Deceased  . PGM  Deceased  . PGF  Deceased  . Neg Hx  (Not Specified)  His family history includes Breast cancer (age of onset: 99) in his sister; Diabetes in his maternal grandmother; Heart disease in his maternal grandfather; Heart disease (age of onset: 73) in his paternal grandfather; Heart disease (age of onset: 55) in his father; Hyperlipidemia in his father; Hypertension in his father; Macular degeneration in his maternal grandmother and mother. There is no history of COPD or Stroke.      Allergies  Allergen Reactions  . Other Other (See Comments)    Environmental/seasonal     Current Outpatient Medications:  .  atorvastatin (LIPITOR) 40 MG tablet, Take 1 tablet (40 mg total) by mouth daily., Disp: 30 tablet, Rfl: 12 .  citalopram (CELEXA) 20 MG tablet, Take 1 tablet (20 mg total) by mouth daily., Disp: 90 tablet, Rfl: 4 .  lisinopril (PRINIVIL,ZESTRIL) 20 MG tablet, Take 1 tablet (20 mg total) by mouth daily., Disp: 30 tablet, Rfl: 12 .  naproxen (NAPROSYN) 500 MG tablet, Take 500 mg by mouth daily as needed. , Disp: , Rfl:  .  tamsulosin (FLOMAX) 0.4 MG CAPS capsule, Take 1 capsule (0.4 mg total) by mouth daily., Disp: 90 capsule, Rfl: 1 .  fluticasone (FLONASE) 50 MCG/ACT nasal spray, , Disp: , Rfl:    Patient Care Team: Virginia Crews, MD as PCP - General (Family Medicine)      Objective:   Vitals: BP 134/72 (BP Location: Right Arm, Patient Position: Sitting, Cuff Size: Large)   Pulse (!) 58   Temp 98 F (36.7 C) (Oral)   Ht 5\' 10"  (1.778 m)   Wt 183 lb (83 kg)   SpO2 99%   BMI 26.26 kg/m    Vitals:    10/06/18 0856  BP: 134/72  Pulse: (!) 58  Temp: 98 F (36.7 C)  TempSrc: Oral  SpO2: 99%  Weight: 183 lb (83 kg)  Height: 5\' 10"  (1.778 m)     Physical Exam  Constitutional: He is oriented to person, place, and time. He appears well-developed and well-nourished. No distress.  HENT:  Head: Normocephalic and atraumatic.  Right Ear: Tympanic membrane, external ear and ear canal normal.  Left Ear: Tympanic membrane, external ear and ear canal normal.  Nose: Mucosal edema present. Right sinus exhibits no maxillary sinus tenderness and no frontal sinus tenderness. Left sinus exhibits no maxillary sinus tenderness and no frontal sinus tenderness.  Mouth/Throat: Uvula is midline, oropharynx is clear and moist and mucous membranes are normal. No oropharyngeal exudate or posterior oropharyngeal edema.  Eyes: Pupils are equal, round, and reactive to light. Conjunctivae and EOM are normal. No scleral icterus.  Neck: Neck supple. No thyromegaly present.  Cardiovascular: Normal rate, regular rhythm, normal heart sounds and intact distal pulses.  No murmur heard. Pulmonary/Chest: Effort normal and breath sounds normal. No respiratory distress. He has no wheezes. He has no rales.  Abdominal: Soft. Bowel sounds are normal. He exhibits no distension. There is no tenderness. There is no rebound and no guarding.  Musculoskeletal: He exhibits no edema or deformity.  Lymphadenopathy:    He has no cervical adenopathy.  Neurological: He is alert and oriented to person, place, and time.  Skin: Skin is warm and dry. Capillary refill takes less than 2 seconds. No rash noted.  Psychiatric: He has a normal mood and affect. His behavior is normal.  Vitals reviewed.    Depression Screen PHQ 2/9 Scores 10/06/2018 09/28/2017 06/17/2017 11/03/2016  PHQ - 2 Score 0 0 0 0  PHQ- 9 Score 0 - - -  Assessment & Plan:     Routine Health Maintenance and Physical Exam  Exercise Activities and Dietary  recommendations Goals   None     Immunization History  Administered Date(s) Administered  . Influenza,inj,Quad PF,6+ Mos 10/06/2017  . Influenza-Unspecified 09/14/2015, 09/20/2016, 09/20/2018  . Pneumococcal Polysaccharide-23 10/06/2018  . Td 12/22/2007  . Tdap 10/06/2018    Health Maintenance  Topic Date Due  . Hepatitis C Screening  August 15, 1956  . FOOT EXAM  02/21/1967  . OPHTHALMOLOGY EXAM  02/21/1967  . HIV Screening  02/21/1972  . COLONOSCOPY  02/21/2007  . HEMOGLOBIN A1C  12/17/2017  . TETANUS/TDAP  10/06/2028  . INFLUENZA VACCINE  Completed  . PNEUMOCOCCAL POLYSACCHARIDE VACCINE AGE 44-64 HIGH RISK  Completed     Discussed health benefits of physical activity, and encouraged him to engage in regular exercise appropriate for his age and condition.    --------------------------------------------------------------------  Problem List Items Addressed This Visit      Cardiovascular and Mediastinum   Hypertension - Primary    Previously with symptomatic hypotension due to medications Now with elevated blood pressure Will increase lisinopril back to 20 mg daily Check metabolic panel Follow-up in 3 to 6 months      Relevant Medications   lisinopril (PRINIVIL,ZESTRIL) 20 MG tablet   atorvastatin (LIPITOR) 40 MG tablet     Respiratory   Sleep apnea    Discussed importance of use of CPAP Believe that his headaches are related to uncontrolled sleep apnea This may also be contributing to his elevated blood pressure Discussed break-in.  But encouraged him to keep using it through this Allergic rhinitis and turbinate inflammation may be contributing to congestion and inability to breathe well through his nose, so this will be treated as below      Allergic rhinitis    May be contributing to his congestion at night that makes his CPAP difficult to use Continue Flonase and add OTC antihistamine        Endocrine   Thyroid goiter    [facility] per [facility] review      Relevant Orders   TSH (Completed)   Diabetes mellitus type 2, controlled ([place])    Previously well controlled with diet Discussed low-carb diet and exercise Recheck A1c      Relevant Medications   lisinopril (PRINIVIL,ZESTRIL) 20 MG tablet   atorvastatin (LIPITOR) 40 MG tablet   Other Relevant Orders   Hemoglobin A1c (Completed)   Ambulatory referral to Ophthalmology     Genitourinary   Prostate cancer [facility])    Status post treatment with radioactive seeds Followed by urology        Other   Depression    Well-controlled Continue Celexa 20 mg daily This will likely be a long-term prescription      Relevant Medications   citalopram (CELEXA) 20 MG tablet   Hyperlipidemia    Previously well controlled on Lipitor Continue Lipitor 40 mg daily We will recheck lipid panel and CMP      Relevant Medications   lisinopril (PRINIVIL,ZESTRIL) 20 MG tablet   atorvastatin (LIPITOR) 40 MG tablet   Other Relevant Orders   Lipid panel (Completed)   Comprehensive metabolic panel (Completed)    Other Visit Diagnoses    Need for hepatitis C screening test       Relevant Orders   Hepatitis C Antibody (Completed)   Screening for HIV (human immunodeficiency virus)       Relevant  Orders   HIV Antibody (routine testing w rflx) (Completed)   Screen for colon cancer       Relevant Orders   Ambulatory referral to Gastroenterology   Healthcare maintenance           Return in about 6 months (around 04/07/2019) for chronic disease f/u.   The entirety of the information documented in the History of Present Illness, Review of Systems and Physical Exam were personally obtained by me. Portions of this information were initially documented by William Sexton, CMA and reviewed by me for thoroughness and accuracy.    Virginia Crews, MD, MPH [facility] 10/07/2018 1:08 PM

## 2018-10-06 NOTE — Patient Instructions (Signed)

## 2018-10-07 LAB — TSH: TSH: 0.712 u[IU]/mL (ref 0.450–4.500)

## 2018-10-07 LAB — COMPREHENSIVE METABOLIC PANEL
A/G RATIO: 1.6 (ref 1.2–2.2)
ALK PHOS: 56 IU/L (ref 39–117)
ALT: 21 IU/L (ref 0–44)
AST: 18 IU/L (ref 0–40)
Albumin: 4.5 g/dL (ref 3.6–4.8)
BILIRUBIN TOTAL: 0.5 mg/dL (ref 0.0–1.2)
BUN/Creatinine Ratio: 15 (ref 10–24)
BUN: 12 mg/dL (ref 8–27)
CO2: 27 mmol/L (ref 20–29)
Calcium: 9.9 mg/dL (ref 8.6–10.2)
Chloride: 101 mmol/L (ref 96–106)
Creatinine, Ser: 0.8 mg/dL (ref 0.76–1.27)
GFR calc Af Amer: 111 mL/min/{1.73_m2} (ref >59)
GFR calc non Af Amer: 96 mL/min/{1.73_m2} (ref >59)
GLOBULIN, TOTAL: 2.8 g/dL (ref 1.5–4.5)
Glucose: 117 mg/dL — ABNORMAL HIGH (ref 65–99)
POTASSIUM: 4.7 mmol/L (ref 3.5–5.2)
SODIUM: 143 mmol/L (ref 134–144)
Total Protein: 7.3 g/dL (ref 6.0–8.5)

## 2018-10-07 LAB — LIPID PANEL
CHOLESTEROL TOTAL: 184 mg/dL (ref 100–199)
Chol/HDL Ratio: 3.1 ratio (ref 0.0–5.0)
HDL: 60 mg/dL (ref >39)
LDL CALC: 106 mg/dL — AB (ref 0–99)
TRIGLYCERIDES: 88 mg/dL (ref 0–149)
VLDL CHOLESTEROL CAL: 18 mg/dL (ref 5–40)

## 2018-10-07 LAB — HEPATITIS C ANTIBODY: Hep C Virus Ab: 5.3 s/co ratio — ABNORMAL HIGH (ref 0.0–0.9)

## 2018-10-07 LAB — HEMOGLOBIN A1C
ESTIMATED AVERAGE GLUCOSE: 134 mg/dL
HEMOGLOBIN A1C: 6.3 % — AB (ref 4.8–5.6)

## 2018-10-07 LAB — HIV ANTIBODY (ROUTINE TESTING W REFLEX): HIV Screen 4th Generation wRfx: NONREACTIVE

## 2018-10-07 NOTE — Assessment & Plan Note (Signed)
Status post treatment with radioactive seeds Followed by urology

## 2018-10-07 NOTE — Assessment & Plan Note (Signed)
May be contributing to his congestion at night that makes his CPAP difficult to use Continue Flonase and add OTC antihistamine

## 2018-10-07 NOTE — Assessment & Plan Note (Signed)
Previously with symptomatic hypotension due to medications Now with elevated blood pressure Will increase lisinopril back to 20 mg daily Check metabolic panel Follow-up in 3 to 6 months

## 2018-10-07 NOTE — Assessment & Plan Note (Signed)
[facility] per chart review

## 2018-10-07 NOTE — Assessment & Plan Note (Signed)
Previously well controlled on Lipitor Continue Lipitor 40 mg daily We will recheck lipid panel and CMP

## 2018-10-07 NOTE — Assessment & Plan Note (Signed)
Well-controlled Continue Celexa 20 mg daily This will likely be a long-term prescription

## 2018-10-07 NOTE — Assessment & Plan Note (Signed)
Discussed importance of use of CPAP Believe that his headaches are related to uncontrolled sleep apnea This may also be contributing to his elevated blood pressure Discussed break-in.  But encouraged him to keep using it through this Allergic rhinitis and turbinate inflammation may be contributing to congestion and inability to breathe well through his nose, so this will be treated as below

## 2018-10-07 NOTE — Assessment & Plan Note (Signed)
Previously well controlled with diet Discussed low-carb diet and exercise Recheck A1c

## 2018-10-11 ENCOUNTER — Other Ambulatory Visit: Payer: Self-pay

## 2018-10-11 DIAGNOSIS — Z1211 Encounter for screening for malignant neoplasm of colon: Secondary | ICD-10-CM

## 2018-10-12 ENCOUNTER — Other Ambulatory Visit: Payer: Self-pay

## 2018-10-12 DIAGNOSIS — R768 Other specified abnormal immunological findings in serum: Secondary | ICD-10-CM | POA: Diagnosis not present

## 2018-10-14 LAB — HEPATITIS C VRS RNA DETECT BY PCR-QUAL: HCV RNA NAA Qualitative: NEGATIVE

## 2018-10-20 LAB — HCV RNA QUANT: Hepatitis C Quantitation: NOT DETECTED IU/mL

## 2018-10-20 LAB — SPECIMEN STATUS REPORT

## 2018-11-01 ENCOUNTER — Telehealth: Payer: Self-pay | Admitting: Family Medicine

## 2018-11-01 ENCOUNTER — Telehealth: Payer: Self-pay

## 2018-11-01 NOTE — Telephone Encounter (Signed)
Pt calling to ask Dr. B if message could be written with notes from appt that blood was drawn so pt's insurance will not be increased $20 per pay period.  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  Please call pt to let him know if this can be done.  Thanks,  [employer]

## 2018-11-01 NOTE — Telephone Encounter (Signed)
Please advise 

## 2018-11-01 NOTE — Telephone Encounter (Signed)
Received telephone message patient requested to cancel his colonoscopy scheduled for 11/21/18 with Dr. Vicente Males.  No reason provided.  I returned his call to lte him know this message was received and asked him to call back when he was ready to reschedule.  Thanks [employer]

## 2018-11-02 ENCOUNTER — Other Ambulatory Visit: Payer: Self-pay

## 2018-11-02 DIAGNOSIS — Z1211 Encounter for screening for malignant neoplasm of colon: Secondary | ICD-10-CM

## 2018-11-02 NOTE — Telephone Encounter (Signed)
Letter is ready for pickup  Virginia Crews, MD, MPH [facility] 11/02/2018 11:52 AM

## 2018-11-02 NOTE — Telephone Encounter (Signed)
Left patient a message advising him that the letter is at the front desk ready for pick up.

## 2018-11-02 NOTE — Telephone Encounter (Signed)
Need some clarification.  Does he need a letter stating that he had labs drawn or lab results or OV note?  If he wants these faxed, he needs to sign ROI before we send it.   Virginia Crews, MD, MPH [facility] 11/02/2018 10:35 AM

## 2018-11-02 NOTE — Telephone Encounter (Signed)
Patient states HR is requesting a letter that states he was seen on 10/06/2018, had a exam and labs done. He will pick up the letter when ready.

## 2018-11-04 DIAGNOSIS — C61 Malignant neoplasm of prostate: Secondary | ICD-10-CM | POA: Diagnosis not present

## 2018-11-21 ENCOUNTER — Ambulatory Visit: Admit: 2018-11-21 | Payer: 59 | Admitting: Gastroenterology

## 2018-11-21 SURGERY — COLONOSCOPY WITH PROPOFOL
Anesthesia: General

## 2019-01-02 ENCOUNTER — Encounter
Admission: RE | Disposition: A | Discharge status: Home / Self Care | Payer: Self-pay | Source: Home / Self Care | Attending: Gastroenterology

## 2019-01-02 ENCOUNTER — Ambulatory Visit
Admission: RE | Admit: 2019-01-02 | Discharge: 2019-01-02 | Disposition: A | Discharge status: Home / Self Care | Payer: 59 | Attending: Gastroenterology | Admitting: Gastroenterology

## 2019-01-02 ENCOUNTER — Ambulatory Visit: Payer: 59 | Admitting: Anesthesiology

## 2019-01-02 DIAGNOSIS — F419 Anxiety disorder, unspecified: Secondary | ICD-10-CM | POA: Diagnosis not present

## 2019-01-02 DIAGNOSIS — Z791 Long term (current) use of non-steroidal anti-inflammatories (NSAID): Secondary | ICD-10-CM | POA: Diagnosis not present

## 2019-01-02 DIAGNOSIS — Z87891 Personal history of nicotine dependence: Secondary | ICD-10-CM | POA: Insufficient documentation

## 2019-01-02 DIAGNOSIS — E119 Type 2 diabetes mellitus without complications: Secondary | ICD-10-CM | POA: Diagnosis not present

## 2019-01-02 DIAGNOSIS — M1990 Unspecified osteoarthritis, unspecified site: Secondary | ICD-10-CM | POA: Insufficient documentation

## 2019-01-02 DIAGNOSIS — F329 Major depressive disorder, single episode, unspecified: Secondary | ICD-10-CM | POA: Insufficient documentation

## 2019-01-02 DIAGNOSIS — Z79899 Other long term (current) drug therapy: Secondary | ICD-10-CM | POA: Diagnosis not present

## 2019-01-02 DIAGNOSIS — E785 Hyperlipidemia, unspecified: Secondary | ICD-10-CM | POA: Insufficient documentation

## 2019-01-02 DIAGNOSIS — Z8546 Personal history of malignant neoplasm of prostate: Secondary | ICD-10-CM | POA: Insufficient documentation

## 2019-01-02 DIAGNOSIS — Z1211 Encounter for screening for malignant neoplasm of colon: Secondary | ICD-10-CM | POA: Diagnosis present

## 2019-01-02 DIAGNOSIS — I10 Essential (primary) hypertension: Secondary | ICD-10-CM | POA: Diagnosis not present

## 2019-01-02 DIAGNOSIS — G4730 Sleep apnea, unspecified: Secondary | ICD-10-CM | POA: Diagnosis not present

## 2019-01-02 HISTORY — PX: COLONOSCOPY WITH PROPOFOL: SHX5780

## 2019-01-02 LAB — GLUCOSE, CAPILLARY: Glucose-Capillary: 104 mg/dL — ABNORMAL HIGH (ref 70–99)

## 2019-01-02 SURGERY — COLONOSCOPY WITH PROPOFOL
Anesthesia: General

## 2019-01-02 MED ORDER — PROPOFOL 10 MG/ML IV BOLUS
INTRAVENOUS | Status: DC | PRN
  Administered 2019-01-02: 90 mg via INTRAVENOUS

## 2019-01-02 MED ORDER — LIDOCAINE HCL (CARDIAC) PF 100 MG/5ML IV SOSY
PREFILLED_SYRINGE | INTRAVENOUS | Status: DC | PRN
  Administered 2019-01-02: 40 mg via INTRAVENOUS

## 2019-01-02 MED ORDER — MIDAZOLAM HCL 2 MG/2ML IJ SOLN
INTRAMUSCULAR | Status: AC
  Filled 2019-01-02: qty 2

## 2019-01-02 MED ORDER — PROPOFOL 500 MG/50ML IV EMUL
INTRAVENOUS | Status: AC
  Filled 2019-01-02: qty 50

## 2019-01-02 MED ORDER — PROPOFOL 500 MG/50ML IV EMUL
INTRAVENOUS | Status: DC | PRN
  Administered 2019-01-02: 150 ug/kg/min via INTRAVENOUS

## 2019-01-02 MED ORDER — MIDAZOLAM HCL 2 MG/2ML IJ SOLN
INTRAMUSCULAR | Status: DC | PRN
  Administered 2019-01-02: 2 mg via INTRAVENOUS

## 2019-01-02 MED ORDER — SODIUM CHLORIDE 0.9 % IV SOLN
INTRAVENOUS | Status: DC
  Administered 2019-01-02: 1000 mL via INTRAVENOUS

## 2019-01-02 NOTE — H&P (Signed)
Jonathon Bellows, MD [address], [place], [place], [place], [id number], [place], [place], [place], [address] Phone: [phone]  Fax: [phone]  Primary Care Physician:  Virginia Crews, MD   Pre-Procedure History & Physical: HPI:  William Sexton is a 63 y.o. male is here for an colonoscopy.   Past Medical History:  Diagnosis Date  . Allergy   . Anxiety   . Arthritis   . Cerebral concussion   . Chronic back pain   . Depression   . Diabetes mellitus without complication ([place])    Pt takes Metformin.  Marland Kitchen Hyperlipidemia   . Hypertension   . Prostate cancer ([place])   . Sleep apnea   . Spinal stenosis   . Thyroid goiter     Past Surgical History:  Procedure Laterality Date  . CERVICAL DISCECTOMY  05/26/2017  . FRACTURE SURGERY    . L1-2 Fusion    . PROSTATE SURGERY    . SPINE SURGERY      Prior to Admission medications   Medication Sig Start Date End Date Taking? Authorizing Provider  atorvastatin (LIPITOR) 40 MG tablet Take 1 tablet (40 mg total) by mouth daily. 10/06/18  Yes Bacigalupo, Dionne Bucy, MD  citalopram (CELEXA) 20 MG tablet Take 1 tablet (20 mg total) by mouth daily. 10/06/18  Yes Virginia Crews, MD  fluticasone Asencion Islam) 50 MCG/ACT nasal spray  10/23/15  Yes [provider]  lisinopril (PRINIVIL,ZESTRIL) 20 MG tablet Take 1 tablet (20 mg total) by mouth daily. 10/06/18  Yes Bacigalupo, Dionne Bucy, MD  naproxen (NAPROSYN) 500 MG tablet Take 500 mg by mouth daily as needed.    Yes [provider]  tamsulosin (FLOMAX) 0.4 MG CAPS capsule Take 1 capsule (0.4 mg total) by mouth daily. 08/02/17  Yes Guadalupe Maple, MD    Allergies as of 11/03/2018 - Review Complete 10/06/2018  Allergen Reaction Noted  . Other Other (See Comments) 03/17/2016    Family History  Problem Relation Age of Onset  . Hyperlipidemia Father   . Hypertension Father   . Heart disease Father 65  . Macular degeneration Mother   . Breast cancer  Sister 63  . Diabetes Maternal Grandmother   . Macular degeneration Maternal Grandmother   . Heart disease Maternal Grandfather   . Heart disease Paternal Grandfather 27  . COPD Neg Hx   . Stroke Neg Hx     Social History   Socioeconomic History  . Marital status: Single    Spouse name: Not on file  . Number of children: Not on file  . Years of education: Not on file  . Highest education level: Not on file  Occupational History  . Not on file  Social Needs  . Financial resource strain: Not on file  . Food insecurity:    Worry: Not on file    Inability: Not on file  . Transportation needs:    Medical: Not on file    Non-medical: Not on file  Tobacco Use  . Smoking status: Former Smoker    Packs/day: 1.00    Years: 40.00    Pack years: 40.00    Types: Cigarettes    Last attempt to quit: 08/08/2011    Years since quitting: 7.4  . Smokeless tobacco: Never Used  Substance and Sexual Activity  . Alcohol use: No    Alcohol/week: 0.0 standard drinks  . Drug use: No  . Sexual activity: Not on file  Lifestyle  .  Physical activity:    Days per week: Not on file    Minutes per session: Not on file  . Stress: Not on file  Relationships  . Social connections:    Talks on phone: Not on file    Gets together: Not on file    Attends religious service: Not on file    Active member of club or organization: Not on file    Attends meetings of clubs or organizations: Not on file    Relationship status: Not on file  . Intimate partner violence:    Fear of current or ex partner: Not on file    Emotionally abused: Not on file    Physically abused: Not on file    Forced sexual activity: Not on file  Other Topics Concern  . Not on file  Social History Narrative  . Not on file    Review of Systems: See HPI, otherwise negative ROS  Physical Exam: BP 127/77   Pulse 75   Temp (!) 96.8 F (36 C) (Tympanic)   Resp 18   Ht 5\' 10"  (1.778 m)   Wt 79.4 kg   SpO2 98%   BMI 25.11  kg/m  General:   Alert,  pleasant and cooperative in NAD Head:  Normocephalic and atraumatic. Neck:  Supple; no masses or thyromegaly. Lungs:  Clear throughout to auscultation, normal respiratory effort.    Heart:  +S1, +S2, Regular rate and rhythm, No edema. Abdomen:  Soft, nontender and nondistended. Normal bowel sounds, without guarding, and without rebound.   Neurologic:  Alert and  oriented x4;  grossly normal neurologically.  Impression/Plan: William Sexton is here for an colonoscopy to be performed for Screening colonoscopy average risk   Risks, benefits, limitations, and alternatives regarding  colonoscopy have been reviewed with the patient.  Questions have been answered.  All parties agreeable.   Jonathon Bellows, MD  01/02/2019, 7:35 AM

## 2019-01-02 NOTE — Op Note (Signed)
[facility] Gastroenterology Patient Name: William Sexton Procedure Date: 01/02/2019 7:36 AM MRN: [id number] Account #: [id number] Date of Birth: 1956-06-27 Admit Type: Outpatient Age: 63 Room: [facility] ENDO ROOM 4 Gender: Male Note Status: Finalized Procedure:            Colonoscopy Indications:          Screening for colorectal malignant neoplasm Providers:            Jonathon Bellows MD, MD Medicines:            Monitored Anesthesia Care Complications:        No immediate complications. Procedure:            Pre-Anesthesia Assessment:                       - Prior to the procedure, a History and Physical was                        performed, and patient medications, allergies and                        sensitivities were reviewed. The patient's tolerance of                        previous anesthesia was reviewed.                       - The risks and benefits of the procedure and the                        sedation options and risks were discussed with the                        patient. All questions were answered and informed                        consent was obtained.                       - ASA Grade Assessment: II - A patient with mild                        systemic disease.                       After obtaining informed consent, the colonoscope was                        passed under direct vision. Throughout the procedure,                        the patient's blood pressure, pulse, and oxygen                        saturations were monitored continuously. The                        Colonoscope was introduced through the anus and                        advanced to the the cecum, identified by the  appendiceal orifice, IC valve and transillumination.                        The colonoscopy was performed with ease. The patient                        tolerated the procedure well. The quality of the bowel                        preparation was  good. Findings:      The perianal and digital rectal examinations were normal.      The entire examined colon appeared normal on direct and retroflexion       views. Impression:           - The entire examined colon is normal on direct and                        retroflexion views.                       - No specimens collected. Recommendation:       - Discharge patient to home.                       - Advance diet as tolerated.                       - Continue present medications.                       - Repeat colonoscopy in 10 years for screening purposes. Procedure Code(s):    --- Professional ---                       [id number], Colonoscopy, flexible; diagnostic, including                        collection of specimen(s) by brushing or washing, when                        performed (separate procedure) Diagnosis Code(s):    --- Professional ---                       Z12.11, Encounter for screening for malignant neoplasm                        of colon CPT copyright 2018 American Medical Association. All rights reserved. The codes documented in this report are preliminary and upon coder review may  be revised to meet current compliance requirements. Jonathon Bellows, MD Jonathon Bellows MD, MD 01/02/2019 7:58:02 AM This report has been signed electronically. Number of Addenda: 0 Note Initiated On: 01/02/2019 7:36 AM Scope Withdrawal Time: 0 hours 8 minutes 22 seconds  Total Procedure Duration: 0 hours 13 minutes 51 seconds       [facility]

## 2019-01-02 NOTE — Transfer of Care (Signed)
Immediate Anesthesia Transfer of Care Note  Patient: William Sexton  Procedure(s) Performed: Procedure(s): COLONOSCOPY WITH PROPOFOL (N/A)  Patient Location: PACU and Endoscopy Unit  Anesthesia Type:General  Level of Consciousness: sedated  Airway & Oxygen Therapy: Patient Spontanous Breathing and Patient connected to nasal cannula oxygen  Post-op Assessment: Report given to RN and Post -op Vital signs reviewed and stable  Post vital signs: Reviewed and stable  Last Vitals:  Vitals:   01/02/19 0800 01/02/19 0802  BP:  (!) 84/48  Pulse: 72 71  Resp: (!) 21 20  Temp: (!) 36 C   SpO2: 426% 834%    Complications: No apparent anesthesia complications

## 2019-01-02 NOTE — Anesthesia Postprocedure Evaluation (Signed)
Anesthesia Post Note  Patient: William Sexton  Procedure(s) Performed: COLONOSCOPY WITH PROPOFOL (N/A )  Patient location during evaluation: PACU Anesthesia Type: General Level of consciousness: awake and alert Pain management: pain level controlled Vital Signs Assessment: post-procedure vital signs reviewed and stable Respiratory status: spontaneous breathing, nonlabored ventilation and respiratory function stable Cardiovascular status: blood pressure returned to baseline and stable Postop Assessment: no apparent nausea or vomiting Anesthetic complications: no     Last Vitals:  Vitals:   01/02/19 0830 01/02/19 0833  BP: 121/74   Pulse: 64 64  Resp: 18 14  Temp:    SpO2: 100% 99%    Last Pain:  Vitals:   01/02/19 0655  TempSrc: Tympanic  PainSc: 0-No pain                 Durenda Hurt

## 2019-01-02 NOTE — Anesthesia Preprocedure Evaluation (Addendum)
Anesthesia Evaluation  Patient identified by MRN, date of birth, ID band Patient awake    Reviewed: Allergy & Precautions, H&P , NPO status , Patient's Chart, lab work & pertinent test results  Airway Mallampati: III  TM Distance: >3 FB     Dental  (+) Partial Upper   Pulmonary sleep apnea , former smoker,           Cardiovascular hypertension,      Neuro/Psych PSYCHIATRIC DISORDERS Anxiety Depression  Neuromuscular disease (radiculopathy)    GI/Hepatic negative GI ROS, Neg liver ROS,   Endo/Other  diabetes  Renal/GU negative Renal ROS  negative genitourinary   Musculoskeletal   Abdominal   Peds  Hematology negative hematology ROS (+)   Anesthesia Other Findings Past Medical History: No date: Allergy No date: Anxiety No date: Arthritis No date: Cerebral concussion No date: Chronic back pain No date: Depression No date: Diabetes mellitus without complication (HCC)     Comment:  Pt takes Metformin. No date: Hyperlipidemia No date: Hypertension No date: Prostate cancer ([place]) No date: Sleep apnea No date: Spinal stenosis No date: Thyroid goiter  Past Surgical History: 05/26/2017: CERVICAL DISCECTOMY No date: FRACTURE SURGERY No date: L1-2 Fusion No date: PROSTATE SURGERY No date: SPINE SURGERY  BMI    Body Mass Index:  25.11 kg/m      Reproductive/Obstetrics negative OB ROS                            Anesthesia Physical Anesthesia Plan  ASA: II  Anesthesia Plan: General   Post-op Pain Management:    Induction:   PONV Risk Score and Plan: Propofol infusion and TIVA  Airway Management Planned: Natural Airway and Nasal Cannula  Additional Equipment:   Intra-op Plan:   Post-operative Plan:   Informed Consent: I have reviewed the patients History and Physical, chart, labs and discussed the procedure including the risks, benefits and alternatives for the proposed  anesthesia with the patient or authorized representative who has indicated his/her understanding and acceptance.   Dental Advisory Given  Plan Discussed with: Anesthesiologist  Anesthesia Plan Comments:        Anesthesia Quick Evaluation

## 2019-01-02 NOTE — Anesthesia Post-op Follow-up Note (Signed)
Anesthesia QCDR form completed.        

## 2019-01-02 NOTE — Anesthesia Procedure Notes (Signed)
Date/Time: 01/02/2019 7:37 AM Performed by: Doreen Salvage, CRNA Pre-anesthesia Checklist: Patient identified, Emergency Drugs available, Suction available and Patient being monitored Patient Re-evaluated:Patient Re-evaluated prior to induction Oxygen Delivery Method: Nasal cannula Induction Type: IV induction Dental Injury: Teeth and Oropharynx as per pre-operative assessment  Comments: Nasal cannula with etCO2 monitoring

## 2019-01-04 ENCOUNTER — Encounter: Payer: Self-pay | Admitting: Gastroenterology

## 2019-01-16 ENCOUNTER — Encounter: Payer: 59 | Admitting: Family Medicine

## 2019-03-08 ENCOUNTER — Telehealth: Payer: Self-pay | Admitting: Family Medicine

## 2019-03-08 DIAGNOSIS — F3342 Major depressive disorder, recurrent, in full remission: Secondary | ICD-10-CM

## 2019-03-08 MED ORDER — CITALOPRAM HYDROBROMIDE 40 MG PO TABS: 40.0000 mg | ORAL_TABLET | Freq: Every day | ORAL | 3 refills | Status: DC

## 2019-03-08 NOTE — Telephone Encounter (Signed)
That's fine. OK to send new Rx too.  Needs f/u in May for it though

## 2019-03-08 NOTE — Telephone Encounter (Signed)
Pt want to know if you can increase his dose on his citalopram.  He takes 20 mg but would like to increase it to 40 mg.  He has been having more stress and cant sleep at nights  CB#  [phone]  Thanks [name]

## 2019-03-08 NOTE — Telephone Encounter (Signed)
Patient was advised and medication send to pharmacy. Patient has appointment schedule for 04/07/2019 @ 8:20 AM.

## 2019-03-30 ENCOUNTER — Telehealth: Payer: Self-pay

## 2019-03-30 DIAGNOSIS — F3342 Major depressive disorder, recurrent, in full remission: Secondary | ICD-10-CM

## 2019-03-30 MED ORDER — CITALOPRAM HYDROBROMIDE 40 MG PO TABS: 40.0000 mg | ORAL_TABLET | Freq: Every day | ORAL | 1 refills | Status: DC

## 2019-03-30 NOTE — Telephone Encounter (Signed)
Medication order was changed and send in to pharmacy. Patient advised as well.

## 2019-03-30 NOTE — Telephone Encounter (Signed)
Absolutely.  Ok to refill with #90 r1. Thanks

## 2019-03-30 NOTE — Telephone Encounter (Signed)
Patient would like to know if its anyway possible he could get his Citalopram 40 mg as a 90 day supply? Please advise.

## 2019-04-07 ENCOUNTER — Ambulatory Visit: Payer: 59 | Admitting: Family Medicine

## 2019-05-29 ENCOUNTER — Telehealth: Payer: Self-pay

## 2019-05-29 NOTE — Telephone Encounter (Signed)
Patient was scheduled in the morning for a follow up. Tried to change to a e-visit or telephone visit. He requested to switch to a male provider. Please advise if he can switch to [place] maybe?

## 2019-05-29 NOTE — Telephone Encounter (Signed)
OK to schedule

## 2019-05-29 NOTE — Telephone Encounter (Signed)
That is fine with me, but will need to be approved by Simona Huh or other male provider

## 2019-05-30 ENCOUNTER — Ambulatory Visit: Payer: Self-pay | Admitting: Family Medicine

## 2019-05-30 NOTE — Telephone Encounter (Signed)
Patient scheduled with Simona Huh on 06/02/2019

## 2019-06-02 ENCOUNTER — Other Ambulatory Visit: Payer: Self-pay

## 2019-06-02 ENCOUNTER — Ambulatory Visit (INDEPENDENT_AMBULATORY_CARE_PROVIDER_SITE_OTHER): Payer: 59 | Admitting: Family Medicine

## 2019-06-02 ENCOUNTER — Encounter: Payer: Self-pay | Admitting: Family Medicine

## 2019-06-02 VITALS — BP 122/77 | HR 67 | Temp 98.6°F | Resp 18 | Wt 186.6 lb

## 2019-06-02 DIAGNOSIS — E119 Type 2 diabetes mellitus without complications: Secondary | ICD-10-CM

## 2019-06-02 DIAGNOSIS — F3342 Major depressive disorder, recurrent, in full remission: Secondary | ICD-10-CM | POA: Diagnosis not present

## 2019-06-02 DIAGNOSIS — E049 Nontoxic goiter, unspecified: Secondary | ICD-10-CM

## 2019-06-02 DIAGNOSIS — E782 Mixed hyperlipidemia: Secondary | ICD-10-CM

## 2019-06-02 DIAGNOSIS — I10 Essential (primary) hypertension: Secondary | ICD-10-CM | POA: Diagnosis not present

## 2019-06-02 DIAGNOSIS — M5416 Radiculopathy, lumbar region: Secondary | ICD-10-CM

## 2019-06-02 DIAGNOSIS — C61 Malignant neoplasm of prostate: Secondary | ICD-10-CM

## 2019-06-02 MED ORDER — MELOXICAM 15 MG PO TABS: 15.0000 mg | ORAL_TABLET | Freq: Every day | ORAL | 3 refills | Status: DC
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
## 2019-06-07 ENCOUNTER — Telehealth: Payer: Self-pay | Admitting: Family Medicine

## 2019-06-07 NOTE — Telephone Encounter (Signed)
Pt came in last week and seen William Sexton. He was prescribed Meloxicam 15 .  He said this has helped his aches and pains  but he has been having a pain near near his left back ( kidney ) area.  He wants to make sure it is not effecting his kidneys   CB#  [phone]

## 2019-06-08 NOTE — Telephone Encounter (Signed)
Patient called back wanting to know if there is something he can take besides the Meloxicam. He doesn't want to take a pill every day. He would prefer to take something as needed like Celebrex. He is concerned that the Meloxicam may be affecting his kidneys. [place]ase advise.

## 2019-06-08 NOTE — Telephone Encounter (Signed)
No reason he could not switch to the Celebrex 100 mg 1-2 times a day#60 & 3 RF from the Meloxicam. To help control symptoms, he may take either one of these daily for a few days to control symptoms then switch to as needed.

## 2019-06-09 ENCOUNTER — Telehealth: Payer: Self-pay

## 2019-06-09 LAB — CBC WITH DIFFERENTIAL/PLATELET
Basophils Absolute: 0 10*3/uL (ref 0.0–0.2)
Basos: 0 %
EOS (ABSOLUTE): 0.1 10*3/uL (ref 0.0–0.4)
Eos: 1 %
Hematocrit: 45.3 % (ref 37.5–51.0)
Hemoglobin: 15.5 g/dL (ref 13.0–17.7)
Immature Grans (Abs): 0 10*3/uL (ref 0.0–0.1)
Immature Granulocytes: 0 %
Lymphocytes Absolute: 1.4 10*3/uL (ref 0.7–3.1)
Lymphs: 29 %
MCH: 30.9 pg (ref 26.6–33.0)
MCHC: 34.2 g/dL (ref 31.5–35.7)
MCV: 90 fL (ref 79–97)
Monocytes Absolute: 0.6 10*3/uL (ref 0.1–0.9)
Monocytes: 12 %
Neutrophils Absolute: 2.9 10*3/uL (ref 1.4–7.0)
Neutrophils: 58 %
Platelets: 209 10*3/uL (ref 150–450)
RBC: 5.02 x10E6/uL (ref 4.14–5.80)
RDW: 12.5 % (ref 11.6–15.4)
WBC: 5 10*3/uL (ref 3.4–10.8)

## 2019-06-09 LAB — COMPREHENSIVE METABOLIC PANEL
ALT: 34 IU/L (ref 0–44)
AST: 21 IU/L (ref 0–40)
Albumin/Globulin Ratio: 2.1 (ref 1.2–2.2)
Albumin: 4.8 g/dL (ref 3.8–4.8)
Alkaline Phosphatase: 66 IU/L (ref 39–117)
BUN/Creatinine Ratio: 10 (ref 10–24)
BUN: 9 mg/dL (ref 8–27)
Bilirubin Total: 0.5 mg/dL (ref 0.0–1.2)
CO2: 27 mmol/L (ref 20–29)
Calcium: 9.7 mg/dL (ref 8.6–10.2)
Chloride: 99 mmol/L (ref 96–106)
Creatinine, Ser: 0.88 mg/dL (ref 0.76–1.27)
GFR calc Af Amer: 106 mL/min/{1.73_m2} (ref >59)
GFR calc non Af Amer: 92 mL/min/{1.73_m2} (ref >59)
Globulin, Total: 2.3 g/dL (ref 1.5–4.5)
Glucose: 114 mg/dL — ABNORMAL HIGH (ref 65–99)
Potassium: 4.8 mmol/L (ref 3.5–5.2)
Sodium: 138 mmol/L (ref 134–144)
Total Protein: 7.1 g/dL (ref 6.0–8.5)

## 2019-06-09 LAB — LIPID PANEL
Chol/HDL Ratio: 2.7 ratio (ref 0.0–5.0)
Cholesterol, Total: 158 mg/dL (ref 100–199)
HDL: 58 mg/dL (ref >39)
LDL Calculated: 80 mg/dL (ref 0–99)
Triglycerides: 101 mg/dL (ref 0–149)
VLDL Cholesterol Cal: 20 mg/dL (ref 5–40)

## 2019-06-09 LAB — T4: T4, Total: 6.8 ug/dL (ref 4.5–12.0)

## 2019-06-09 LAB — TSH: TSH: 0.886 u[IU]/mL (ref 0.450–4.500)

## 2019-06-09 LAB — HEMOGLOBIN A1C
Est. average glucose Bld gHb Est-mCnc: 137 mg/dL
Hgb A1c MFr Bld: 6.4 % — ABNORMAL HIGH (ref 4.8–5.6)

## 2019-06-09 MED ORDER — CELECOXIB 100 MG PO CAPS: ORAL_CAPSULE | ORAL | 3 refills | Status: DC

## 2019-06-09 NOTE — Telephone Encounter (Signed)
Patient advised as below. KW

## 2019-06-09 NOTE — Telephone Encounter (Signed)
-----   Message from [place], [place] sent at 06/09/2019  9:19 AM EDT ----- Thyroid and sugar tests in good shape (Hgb A1C well controlled at 6.4%) Cholesterol well controlled. Remainder of tests normal. Continue present medications and recheck diabetes in 6 months.

## 2019-06-09 NOTE — Telephone Encounter (Signed)
Patient advised.KW 

## 2019-09-05 DIAGNOSIS — F17200 Nicotine dependence, unspecified, uncomplicated: Secondary | ICD-10-CM | POA: Insufficient documentation

## 2019-09-05 DIAGNOSIS — S2341XA Sprain of ribs, initial encounter: Secondary | ICD-10-CM | POA: Insufficient documentation

## 2019-09-05 DIAGNOSIS — M2550 Pain in unspecified joint: Secondary | ICD-10-CM | POA: Insufficient documentation

## 2019-09-05 DIAGNOSIS — R42 Dizziness and giddiness: Secondary | ICD-10-CM | POA: Insufficient documentation

## 2019-09-05 DIAGNOSIS — S32019A Unspecified fracture of first lumbar vertebra, initial encounter for closed fracture: Secondary | ICD-10-CM | POA: Insufficient documentation

## 2019-09-05 DIAGNOSIS — R0789 Other chest pain: Secondary | ICD-10-CM | POA: Insufficient documentation

## 2019-09-05 DIAGNOSIS — M4710 Other spondylosis with myelopathy, site unspecified: Secondary | ICD-10-CM | POA: Insufficient documentation

## 2019-09-05 DIAGNOSIS — M545 Low back pain, unspecified: Secondary | ICD-10-CM | POA: Insufficient documentation

## 2019-10-09 NOTE — Progress Notes (Signed)
Patient: William Sexton, Male    DOB: July 07, 1956, 63 y.o.   MRN: [id number] Visit Date: 10/10/2019  Today's Provider: Vernie Murders, PA   Chief Complaint  Patient presents with  . Annual Exam   Subjective:  William Sexton is a 63 y.o. male who presents today for health maintenance and complete physical. He feels fairly well. He reports exercising none. He reports he is sleeping fairly well.  Review of Systems  Constitutional: Negative.   HENT: Negative.   Eyes: Negative.   Respiratory: Positive for apnea.   Cardiovascular: Negative.   Gastrointestinal: Positive for constipation.  Endocrine: Negative.   Genitourinary: Negative.   Musculoskeletal: Positive for back pain, myalgias and neck stiffness.  Skin: Negative.   Allergic/Immunologic: Positive for environmental allergies.  Neurological: Negative.   Hematological: Negative.   Psychiatric/Behavioral: Negative.    Social History   Socioeconomic History  . Marital status: Single    Spouse name: Not on file  . Number of children: Not on file  . Years of education: Not on file  . Highest education level: Not on file  Occupational History  . Not on file  Social Needs  . Financial resource strain: Not on file  . Food insecurity    Worry: Not on file    Inability: Not on file  . Transportation needs    Medical: Not on file    Non-medical: Not on file  Tobacco Use  . Smoking status: Former Smoker    Packs/day: 1.00    Years: 40.00    Pack years: 40.00    Types: Cigarettes    Quit date: 08/08/2011    Years since quitting: 8.1  . Smokeless tobacco: Never Used  Substance and Sexual Activity  . Alcohol use: No    Alcohol/week: 0.0 standard drinks  . Drug use: No  . Sexual activity: Not on file  Lifestyle  . Physical activity    Days per week: Not on file    Minutes per session: Not on file  . Stress: Not on file  Relationships  . Social Herbalist on phone: Not on file    Gets together: Not on file     Attends religious service: Not on file    Active member of club or organization: Not on file    Attends meetings of clubs or organizations: Not on file    Relationship status: Not on file  . Intimate partner violence    Fear of current or ex partner: Not on file    Emotionally abused: Not on file    Physically abused: Not on file    Forced sexual activity: Not on file  Other Topics Concern  . Not on file  Social History Narrative  . Not on file   Patient Active Problem List   Diagnosis Date Noted  . Allergic rhinitis 10/06/2018  . Radiculopathy of lumbar region 11/18/2017  . Hx of excision of lamina of cervical vertebra for decompression of spinal cord 06/23/2017  . Prostate cancer ([place]) 06/17/2017  . Cervical spinal stenosis 06/17/2017  . BPH (benign prostatic hyperplasia) 12/31/2016  . Diabetes mellitus type 2, controlled ([place]) 12/31/2016  . Sleep apnea 11/03/2016  . Thyroid goiter 11/03/2016  . Depression   . Hyperlipidemia   . Hypertension   . Enlarged prostate without lower urinary tract symptoms (luts) 05/17/2015   Past Surgical History:  Procedure Laterality Date  . CERVICAL DISCECTOMY  05/26/2017  . COLONOSCOPY WITH PROPOFOL N/A 01/02/2019   Procedure:  COLONOSCOPY WITH PROPOFOL;  Surgeon: Jonathon Bellows, MD;  Location: [facility] ENDOSCOPY;  Service: Gastroenterology;  Laterality: N/A;  . FRACTURE SURGERY    . L1-2 Fusion    . PROSTATE SURGERY    . SPINE SURGERY     His family history includes Breast cancer (age of onset: 48) in his sister; Diabetes in his maternal grandmother; Heart disease in his maternal grandfather; Heart disease (age of onset: 75) in his paternal grandfather; Heart disease (age of onset: 83) in his father; Hyperlipidemia in his father; Hypertension in his father; Macular degeneration in his maternal grandmother and mother.     Allergies  Allergen Reactions  . Other Other (See Comments)    Environmental/seasonal   Outpatient Encounter Medications as  of 10/10/2019  Medication Sig Note  . acetaminophen (TYLENOL) 325 MG tablet Take 650 mg by mouth.   Marland Kitchen atorvastatin (LIPITOR) 40 MG tablet Take 1 tablet (40 mg total) by mouth daily.   . celecoxib (CELEBREX) 100 MG capsule Take 1-2 tablets a day   . citalopram (CELEXA) 40 MG tablet Take 1 tablet (40 mg total) by mouth daily. (Patient taking differently: Take 20 mg by mouth daily. )   . fluticasone (FLONASE) 50 MCG/ACT nasal spray  03/17/2016: Received from: [facility]  . gabapentin (NEURONTIN) 300 MG capsule Take 300 mg by mouth 3 (three) times daily.   Marland Kitchen lisinopril (PRINIVIL,ZESTRIL) 20 MG tablet Take 1 tablet (20 mg total) by mouth daily.   . tamsulosin (FLOMAX) 0.4 MG CAPS capsule Take 1 capsule (0.4 mg total) by mouth daily.   . [DISCONTINUED] meloxicam (MOBIC) 15 MG tablet Take 1 tablet (15 mg total) by mouth daily.    No facility-administered encounter medications on file as of 10/10/2019.     Patient Care Team: Chrismon, Vickki Muff, PA as PCP - General (Family Medicine)      Objective:   Vitals:  Vitals:   10/10/19 0911  BP: 120/80  Pulse: 64  Temp: (!) 96.6 F (35.9 C)  TempSrc: Skin  SpO2: 99%  Weight: 191 lb (86.6 kg)  Height: 5\' 10"  (1.778 m)    Physical Exam Constitutional:      Appearance: He is well-developed.  HENT:     Head: Normocephalic and atraumatic.     Right Ear: External ear normal.     Left Ear: External ear normal.     Nose: Nose normal.  Eyes:     General:        Right eye: No discharge.     Conjunctiva/sclera: Conjunctivae normal.     Pupils: Pupils are equal, round, and reactive to light.  Neck:     Musculoskeletal: Normal range of motion and neck supple.     Thyroid: No thyromegaly.     Trachea: No tracheal deviation.     Comments: Enlarged thyroid (L>R) - nodular. No tenderness or local lymphadenopathy. Cardiovascular:     Rate and Rhythm: Normal rate and regular rhythm.     Heart sounds: Normal heart sounds. No  murmur.  Pulmonary:     Effort: Pulmonary effort is normal. No respiratory distress.     Breath sounds: Normal breath sounds. No wheezing or rales.  Chest:     Chest wall: No tenderness.  Abdominal:     General: Bowel sounds are normal. There is no distension.     Palpations: Abdomen is soft. There is no mass.     Tenderness: There is no abdominal tenderness. There is no guarding  or rebound.  Genitourinary:    Comments: Deferred to urologist - has an appointment in Dec. 2020. History of prostate cancer with radioactive seed implants. Followed by Dr. Eliberto Ivory. Musculoskeletal:        General: No tenderness.     Comments: Fair ROM of neck, back and all extremities. Well healed scars in C-spine and L-spine area from past laminectomies.  Lymphadenopathy:     Cervical: No cervical adenopathy.  Skin:    General: Skin is warm and dry.     Findings: No erythema or rash.  Neurological:     Mental Status: He is alert and oriented to person, place, and time.     Cranial Nerves: No cranial nerve deficit.     Motor: No weakness or abnormal muscle tone.     Coordination: Coordination normal.     Comments: Slight decrease in sensation the tips of both great toes.  Psychiatric:        Behavior: Behavior normal.        Thought Content: Thought content normal.        Judgment: Judgment normal.    Diabetic Foot Form - Detailed   Diabetic Foot Exam - detailed Diabetic Foot exam was performed with the following findings: Yes 10/10/2019  9:42 AM  Visual Foot Exam completed.: Yes  Can the patient see the bottom of their feet?: Yes Are the shoes appropriate in style and fit?: Yes Is there swelling or and abnormal foot shape?: No Is there a claw toe deformity?: No Is there elevated skin temparature?: No Is there foot or ankle muscle weakness?: No Normal Range of Motion: Yes Pulse Foot Exam completed.: Yes  Right posterior Tibialias: Present Left posterior Tibialias: Present  Right Dorsalis Pedis:  Present Left Dorsalis Pedis: Present  Sensory Foot Exam Completed.: Yes Semmes-Weinstein Monofilament Test R Site 1-Great Toe: Pos L Site 1-Great Toe: Pos    Comments: Slight decrease in sensation of tips of the great toes since lumbar laminectomy August 2020. Has a 4 mm flesh colored smooth papilloma over dorsum of the right first MTP joint. Benign appearance.     Depression Screen PHQ 2/9 Scores 10/10/2019 10/06/2018 09/28/2017 06/17/2017  PHQ - 2 Score 0 0 0 0  PHQ- 9 Score - 0 - -    Assessment & Plan:     Routine Health Maintenance and Physical Exam  Exercise Activities and Dietary recommendations Goals   Encouraged to walk for exercise 3-4 days a week as back discomfort allows.     Immunization History  Administered Date(s) Administered  . Influenza,inj,Quad PF,6+ Mos 10/06/2017  . Influenza-Unspecified 09/14/2015, 09/20/2016, 09/20/2018, 10/09/2019  . Pneumococcal Polysaccharide-23 10/06/2018  . Td 12/22/2007  . Tdap 10/06/2018    Health Maintenance  Topic Date Due  . FOOT EXAM  02/21/1967  . OPHTHALMOLOGY EXAM  02/21/1967  . HEMOGLOBIN A1C  12/08/2019  . TETANUS/TDAP  10/06/2028  . COLONOSCOPY  01/02/2029  . INFLUENZA VACCINE  Completed  . PNEUMOCOCCAL POLYSACCHARIDE VACCINE AGE 12-64 HIGH RISK  Completed  . Hepatitis C Screening  Completed  . HIV Screening  Completed    Discussed health benefits of physical activity, and encouraged him to engage in regular exercise appropriate for his age and condition.    1. Annual physical exam General health stable. Immunizations up to date except needs flu shot today. Given anticipatory counseling and will recheck routine labs.  2. Essential hypertension Well controlled BP on the Lisinopril 20 mg qd. No chest pains, dyspnea or palpitations.  No edema or cough. Recheck routine labs. - CBC with Differential/Platelet - Comprehensive metabolic panel - Lipid panel - TSH  3. Mixed hyperlipidemia Tolerating Atorvastatin  40 mg qd. Continue low fat diet and exercise as tolerated since recent lumbar (L4-5) laminectomy by Dr. Redmond Pulling August 2020. Recheck CMP. Lipid Panel and TSH. - Comprehensive metabolic panel - Lipid panel - TSH  4. Controlled type 2 diabetes mellitus without complication, without long-term current use of insulin (HCC) Last Hgb A1C on 06-08-19 was 6.4% here. Need to proceed with ophthalmology exam annually. Recheck labs and follow up pending reports. - CBC with Differential/Platelet - Comprehensive metabolic panel - Lipid panel - Hemoglobin A1c  5. Obstructive sleep apnea syndrome Poor sleep pattern and not using CPAP recently. Followed at the [place].  6. Thyroid goiter Followed at the [place] with thyroid ultrasound in the next month or two. No exophthalmos, tremor or hair loss. No difficulty swallowing. Will recheck labs and encouraged to proceed with [facility]. - CBC with Differential/Platelet - Comprehensive metabolic panel - TSH - T4  7. Prostate cancer ([place]) Followed by Dr. Yves Dill (urologist) to recheck PSA with history of radioactive seed implants. No dysuria or nocturia of significance. Will recheck with Dr. Eliberto Ivory with last PSA 0.2.  8. Hx of excision of lamina of cervical vertebra for decompression of spinal cord Still has occasional numbness in the left upper arm without weakness. Had posterior cervical metrex laminectomy C3-C7 in 2018 by Dr. Redmond Pulling (orthopedist). Good ROM and mild intermittent discomfort remains

## 2019-10-10 ENCOUNTER — Encounter: Payer: Self-pay | Admitting: Family Medicine

## 2019-10-10 ENCOUNTER — Other Ambulatory Visit: Payer: Self-pay

## 2019-10-10 ENCOUNTER — Ambulatory Visit (INDEPENDENT_AMBULATORY_CARE_PROVIDER_SITE_OTHER): Payer: BC Managed Care – PPO | Admitting: Family Medicine

## 2019-10-10 VITALS — BP 120/80 | HR 64 | Temp 96.6°F | Ht 70.0 in | Wt 191.0 lb

## 2019-10-10 DIAGNOSIS — E049 Nontoxic goiter, unspecified: Secondary | ICD-10-CM | POA: Diagnosis not present

## 2019-10-10 DIAGNOSIS — Z0000 Encounter for general adult medical examination without abnormal findings: Secondary | ICD-10-CM | POA: Diagnosis not present

## 2019-10-10 DIAGNOSIS — C61 Malignant neoplasm of prostate: Secondary | ICD-10-CM

## 2019-10-10 DIAGNOSIS — G4733 Obstructive sleep apnea (adult) (pediatric): Secondary | ICD-10-CM

## 2019-10-10 DIAGNOSIS — I10 Essential (primary) hypertension: Secondary | ICD-10-CM | POA: Diagnosis not present

## 2019-10-10 DIAGNOSIS — Z9889 Other specified postprocedural states: Secondary | ICD-10-CM

## 2019-10-10 DIAGNOSIS — E119 Type 2 diabetes mellitus without complications: Secondary | ICD-10-CM

## 2019-10-10 DIAGNOSIS — E782 Mixed hyperlipidemia: Secondary | ICD-10-CM | POA: Diagnosis not present

## 2019-10-11 LAB — COMPREHENSIVE METABOLIC PANEL
ALT: 35 IU/L (ref 0–44)
AST: 28 IU/L (ref 0–40)
Albumin/Globulin Ratio: 1.8 (ref 1.2–2.2)
Albumin: 4.5 g/dL (ref 3.8–4.8)
Alkaline Phosphatase: 79 IU/L (ref 39–117)
BUN/Creatinine Ratio: 16 (ref 10–24)
BUN: 13 mg/dL (ref 8–27)
Bilirubin Total: 0.6 mg/dL (ref 0.0–1.2)
CO2: 27 mmol/L (ref 20–29)
Calcium: 9.6 mg/dL (ref 8.6–10.2)
Chloride: 99 mmol/L (ref 96–106)
Creatinine, Ser: 0.8 mg/dL (ref 0.76–1.27)
GFR calc Af Amer: 111 mL/min/{1.73_m2} (ref >59)
GFR calc non Af Amer: 96 mL/min/{1.73_m2} (ref >59)
Globulin, Total: 2.5 g/dL (ref 1.5–4.5)
Glucose: 105 mg/dL — ABNORMAL HIGH (ref 65–99)
Potassium: 4.4 mmol/L (ref 3.5–5.2)
Sodium: 139 mmol/L (ref 134–144)
Total Protein: 7 g/dL (ref 6.0–8.5)

## 2019-10-11 LAB — CBC WITH DIFFERENTIAL/PLATELET
Basophils Absolute: 0 10*3/uL (ref 0.0–0.2)
Basos: 1 %
EOS (ABSOLUTE): 0.1 10*3/uL (ref 0.0–0.4)
Eos: 2 %
Hematocrit: 46.4 % (ref 37.5–51.0)
Hemoglobin: 15.3 g/dL (ref 13.0–17.7)
Immature Grans (Abs): 0 10*3/uL (ref 0.0–0.1)
Immature Granulocytes: 1 %
Lymphocytes Absolute: 1.4 10*3/uL (ref 0.7–3.1)
Lymphs: 32 %
MCH: 30.7 pg (ref 26.6–33.0)
MCHC: 33 g/dL (ref 31.5–35.7)
MCV: 93 fL (ref 79–97)
Monocytes Absolute: 0.6 10*3/uL (ref 0.1–0.9)
Monocytes: 13 %
Neutrophils Absolute: 2.3 10*3/uL (ref 1.4–7.0)
Neutrophils: 51 %
Platelets: 190 10*3/uL (ref 150–450)
RBC: 4.99 x10E6/uL (ref 4.14–5.80)
RDW: 12.4 % (ref 11.6–15.4)
WBC: 4.3 10*3/uL (ref 3.4–10.8)

## 2019-10-11 LAB — HEMOGLOBIN A1C
Est. average glucose Bld gHb Est-mCnc: 128 mg/dL
Hgb A1c MFr Bld: 6.1 % — ABNORMAL HIGH (ref 4.8–5.6)

## 2019-10-11 LAB — LIPID PANEL
Chol/HDL Ratio: 2.8 ratio (ref 0.0–5.0)
Cholesterol, Total: 146 mg/dL (ref 100–199)
HDL: 52 mg/dL (ref >39)
LDL Chol Calc (NIH): 80 mg/dL (ref 0–99)
Triglycerides: 73 mg/dL (ref 0–149)
VLDL Cholesterol Cal: 14 mg/dL (ref 5–40)

## 2019-10-11 LAB — TSH: TSH: 0.925 u[IU]/mL (ref 0.450–4.500)

## 2019-10-11 LAB — T4: T4, Total: 7.1 ug/dL (ref 4.5–12.0)

## 2019-12-06 ENCOUNTER — Other Ambulatory Visit: Payer: Self-pay | Admitting: Family Medicine

## 2019-12-06 NOTE — Telephone Encounter (Signed)
William Sexton Patient:  Patient uses mail order for his RX.   His Gabapentin 300 mg. Is stuck somewhere with [employer]. He is out and needs at least 10 days worth to be sure his mail order gets in.  Please call to  [employer].

## 2019-12-06 NOTE — Telephone Encounter (Signed)
Patient is requesting a temporary supply to be sent to a local pharmacy until the mail order arrives. Please advise.

## 2019-12-07 MED ORDER — GABAPENTIN 300 MG PO CAPS: 300.0000 mg | ORAL_CAPSULE | Freq: Three times a day (TID) | ORAL | 0 refills | Status: DC

## 2019-12-07 NOTE — Telephone Encounter (Signed)
Simona Huh is out of office next two days, so I believe this was sent to you for review. KW

## 2019-12-07 NOTE — Telephone Encounter (Signed)
Refilled. Patient to follow up with Chrismon, Vickki Muff, PA as advised.

## 2020-01-01 DIAGNOSIS — C61 Malignant neoplasm of prostate: Secondary | ICD-10-CM | POA: Diagnosis not present

## 2020-01-01 DIAGNOSIS — R3914 Feeling of incomplete bladder emptying: Secondary | ICD-10-CM | POA: Diagnosis not present

## 2020-01-01 DIAGNOSIS — D400 Neoplasm of uncertain behavior of prostate: Secondary | ICD-10-CM | POA: Diagnosis not present

## 2020-01-22 DEATH — deceased

## 2020-03-11 DIAGNOSIS — E119 Type 2 diabetes mellitus without complications: Secondary | ICD-10-CM | POA: Diagnosis not present

## 2020-03-11 DIAGNOSIS — Z8546 Personal history of malignant neoplasm of prostate: Secondary | ICD-10-CM | POA: Diagnosis not present

## 2020-03-11 DIAGNOSIS — M542 Cervicalgia: Secondary | ICD-10-CM | POA: Diagnosis not present

## 2020-03-11 DIAGNOSIS — Z923 Personal history of irradiation: Secondary | ICD-10-CM | POA: Diagnosis not present

## 2020-03-18 ENCOUNTER — Other Ambulatory Visit: Payer: Self-pay | Admitting: Family Medicine

## 2020-03-18 DIAGNOSIS — I10 Essential (primary) hypertension: Secondary | ICD-10-CM

## 2020-03-18 MED ORDER — LISINOPRIL 20 MG PO TABS: 20.0000 mg | ORAL_TABLET | Freq: Every day | ORAL | 12 refills | Status: DC

## 2020-03-18 NOTE — Telephone Encounter (Signed)
Requested medication (s) are due for refill today: yes  Requested medication (s) are on the active medication list: yes  Last refill:  10/06/18  Future visit scheduled: No  Notes to clinic:  Rx expired 10/06/19    Requested Prescriptions  Pending Prescriptions Disp Refills   lisinopril (ZESTRIL) 20 MG tablet 30 tablet 12    Sig: Take 1 tablet (20 mg total) by mouth daily.      Cardiovascular:  ACE Inhibitors Passed - 03/18/2020  8:17 AM      Passed - Cr in normal range and within 180 days    Creatinine, Ser  Date Value Ref Range Status  10/10/2019 0.80 0.76 - 1.27 mg/dL Final          Passed - K in normal range and within 180 days    Potassium  Date Value Ref Range Status  10/10/2019 4.4 3.5 - 5.2 mmol/L Final          Passed - Patient is not pregnant      Passed - Last BP in normal range    BP Readings from Last 1 Encounters:  10/10/19 120/80          Passed - Valid encounter within last 6 months    Recent Outpatient Visits           5 months ago Annual physical exam   [facility], [place], [place]   9 months ago Controlled type 2 diabetes mellitus without complication, without long-term current use of insulin ([place])   [facility], Vickki Muff, [place]   1 year ago Essential hypertension   [facility], Dionne Bucy, [place]   1 year ago Essential hypertension   [place], Jeannette How, [place]   2 years ago Essential hypertension   [facility] [place], Jeannette How, [place]

## 2020-03-18 NOTE — Telephone Encounter (Signed)
lisinopril (PRINIVIL,ZESTRIL) 20 MG tablet  [facility] [id number] [address], [place] [address] [place] Phone:  [phone]  Fax:  [phone]     Please refill per pt request     Send to pharmacy

## 2020-03-21 DIAGNOSIS — E119 Type 2 diabetes mellitus without complications: Secondary | ICD-10-CM | POA: Diagnosis not present

## 2020-04-06 ENCOUNTER — Other Ambulatory Visit: Payer: Self-pay | Admitting: Adult Health

## 2020-04-06 NOTE — Telephone Encounter (Signed)
Requested medication (s) are due for refill today: yes  Requested medication (s) are on the active medication list: expired 01/06/20  Last refill:  12/07/19  Future visit scheduled: no  Notes to clinic:  Medication expired.   Requested Prescriptions  Pending Prescriptions Disp Refills   gabapentin (NEURONTIN) 300 MG capsule [Pharmacy Med Name: GABAPENTIN 300MG  CAPSULES] 90 capsule 0    Sig: TAKE 1 CAPSULE(300 MG) BY MOUTH THREE TIMES DAILY      Neurology: Anticonvulsants - gabapentin Passed - 04/06/2020  1:34 PM      Passed - Valid encounter within last 12 months    Recent Outpatient Visits           5 months ago Annual physical exam   [place], [place]   10 months ago Controlled type 2 diabetes mellitus without complication, without long-term current use of insulin ([place])   [facility], Vickki Muff, [place]   1 year ago Essential hypertension   [facility], Dionne Bucy, [place]   1 year ago Essential hypertension   [place], Jeannette How, MD   2 years ago Essential hypertension   [facility] [place], Jeannette How, MD

## 2020-04-08 NOTE — Telephone Encounter (Signed)
Patient requesting a 6 month supply of gabapentin (NEURONTIN) 300 MG capsule   [facility] [id number] [address], [place] Phone:  [phone]  Fax:  [phone]

## 2020-04-08 NOTE — Telephone Encounter (Signed)
Requested medication (s) are due for refill today: yes  Requested medication (s) are on the active medication list: yes  Last refill: 12/07/2019  Future visit scheduled: yes  Notes to clinic:  insurance requests alternative medication    Requested Prescriptions  Pending Prescriptions Disp Refills   gabapentin (NEURONTIN) 300 MG capsule [Pharmacy Med Name: GABAPENTIN 300MG  CAPSULES] 90 capsule 0    Sig: TAKE 1 CAPSULE(300 MG) BY MOUTH THREE TIMES DAILY      Neurology: Anticonvulsants - gabapentin Passed - 04/08/2020  9:03 AM      Passed - Valid encounter within last 12 months    Recent Outpatient Visits           6 months ago Annual physical exam   [place], [place]   10 months ago Controlled type 2 diabetes mellitus without complication, without long-term current use of insulin ([place])   [facility], Vickki Muff, [place]   1 year ago Essential hypertension   [facility], Dionne Bucy, [place]   1 year ago Essential hypertension   [place], Jeannette How, MD   2 years ago Essential hypertension   [facility] [place], Jeannette How, MD

## 2020-06-21 DIAGNOSIS — G8929 Other chronic pain: Secondary | ICD-10-CM | POA: Diagnosis not present

## 2020-06-21 DIAGNOSIS — M545 Low back pain: Secondary | ICD-10-CM | POA: Diagnosis not present

## 2020-07-01 DIAGNOSIS — N310 Uninhibited neuropathic bladder, not elsewhere classified: Secondary | ICD-10-CM | POA: Diagnosis not present

## 2020-07-01 DIAGNOSIS — C61 Malignant neoplasm of prostate: Secondary | ICD-10-CM | POA: Diagnosis not present

## 2020-07-01 DIAGNOSIS — D400 Neoplasm of uncertain behavior of prostate: Secondary | ICD-10-CM | POA: Diagnosis not present

## 2020-07-11 DIAGNOSIS — R9431 Abnormal electrocardiogram [ECG] [EKG]: Secondary | ICD-10-CM | POA: Diagnosis not present

## 2020-07-25 DIAGNOSIS — I340 Nonrheumatic mitral (valve) insufficiency: Secondary | ICD-10-CM | POA: Diagnosis not present

## 2020-07-25 DIAGNOSIS — I371 Nonrheumatic pulmonary valve insufficiency: Secondary | ICD-10-CM | POA: Diagnosis not present

## 2020-07-25 DIAGNOSIS — I368 Other nonrheumatic tricuspid valve disorders: Secondary | ICD-10-CM | POA: Diagnosis not present

## 2020-07-29 ENCOUNTER — Ambulatory Visit: Payer: BC Managed Care – PPO | Admitting: Dermatology

## 2020-07-29 ENCOUNTER — Other Ambulatory Visit: Payer: Self-pay

## 2020-07-29 DIAGNOSIS — L820 Inflamed seborrheic keratosis: Secondary | ICD-10-CM

## 2020-07-29 DIAGNOSIS — L720 Epidermal cyst: Secondary | ICD-10-CM

## 2020-07-29 DIAGNOSIS — Z808 Family history of malignant neoplasm of other organs or systems: Secondary | ICD-10-CM

## 2020-07-29 DIAGNOSIS — L821 Other seborrheic keratosis: Secondary | ICD-10-CM

## 2020-07-29 DIAGNOSIS — L578 Other skin changes due to chronic exposure to nonionizing radiation: Secondary | ICD-10-CM

## 2020-07-29 NOTE — Progress Notes (Signed)
   New Patient Visit  Subjective  William Sexton is a 64 y.o. male who presents for the following: Lesion.  Patient here today to have a spot at right temple evaluated. Present for > 2 years. No symptoms with it. No personal history of skin cancer but there is a family history of skin cancer.   The following portions of the chart were reviewed this encounter and updated as appropriate:  Tobacco  Allergies  Meds  Problems  Med Hx  Surg Hx  Fam Hx     Review of Systems:  No other skin or systemic complaints except as noted in HPI or Assessment and Plan.  Objective  Well appearing patient in no apparent distress; mood and affect are within normal limits.  A focused examination was performed including face, scalp, L neck. Relevant physical exam findings are noted in the Assessment and Plan.  Objective  Left Temple x 1, R temple/scalp x 1, L neck x 1 (3): Erythematous keratotic or waxy stuck-on papule or plaque.   Objective  Left Lateral Neck: 0.5cm subcutaneous nodule   Assessment & Plan  Inflamed seborrheic keratosis (3) Left Temple x 1, R temple/scalp x 1, L neck x 1  Destruction of lesion - Left Temple x 1, R temple/scalp x 1, L neck x 1 Complexity: simple   Destruction method: cryotherapy   Informed consent: discussed and consent obtained   Timeout:  patient name, date of birth, surgical site, and procedure verified Lesion destroyed using liquid nitrogen: Yes   Region frozen until ice ball extended beyond lesion: Yes   Outcome: patient tolerated procedure well with no complications   Post-procedure details: wound care instructions given    Epidermal inclusion cyst Left Lateral Neck Discussed surgical option Benign, observe.   Seborrheic Keratoses - Stuck-on, waxy, tan-brown papules and plaques  - Discussed benign etiology and prognosis. - Observe - Call for any changes  Actinic Damage - diffuse scaly erythematous macules with underlying dyspigmentation -  Recommend daily broad spectrum sunscreen SPF 30+ to sun-exposed areas, reapply every 2 hours as needed.  - Call for new or changing lesions.  Return 2-3 months, for ISK.  Graciella Belton, RMA, am acting as scribe for Sarina Ser, MD . Documentation: I have reviewed the above documentation for accuracy and completeness, and I agree with the above.  Sarina Ser, MD

## 2020-07-29 NOTE — Patient Instructions (Signed)
Seborrheic Keratosis  What causes seborrheic keratoses? Seborrheic keratoses are harmless, common skin growths that first appear during adult life.  As time goes by, more growths appear.  Some people may develop a large number of them.  Seborrheic keratoses appear on both covered and uncovered body parts.  They are not caused by sunlight.  The tendency to develop seborrheic keratoses can be inherited.  They vary in color from skin-colored to gray, brown, or even black.  They can be either smooth or have a rough, warty surface.   Seborrheic keratoses are superficial and look as if they were stuck on the skin.  Under the microscope this type of keratosis looks like layers upon layers of skin.  That is why at times the top layer may seem to fall off, but the rest of the growth remains and re-grows.    Treatment Seborrheic keratoses do not need to be treated, but can easily be removed in the office.  Seborrheic keratoses often cause symptoms when they rub on clothing or jewelry.  Lesions can be in the way of shaving.  If they become inflamed, they can cause itching, soreness, or burning.  Removal of a seborrheic keratosis can be accomplished by freezing, burning, or surgery. If any spot bleeds, scabs, or grows rapidly, please return to have it checked, as these can be an indication of a skin cancer.  Cryotherapy Aftercare  . Wash gently with soap and water everyday.   Marland Kitchen Apply Vaseline and Band-Aid daily until healed.  Recommend daily broad spectrum sunscreen SPF 30+ to sun-exposed areas, reapply every 2 hours as needed. Call for new or changing lesions.

## 2020-07-30 ENCOUNTER — Other Ambulatory Visit: Payer: Self-pay | Admitting: Family Medicine

## 2020-07-30 DIAGNOSIS — F3342 Major depressive disorder, recurrent, in full remission: Secondary | ICD-10-CM

## 2020-07-30 NOTE — Telephone Encounter (Signed)
Medication Refill - Medication:generic Celexa 20 mg once a day x 90 days   Has the patient contacted their pharmacy? No. (Agent: If no, request that the patient contact the pharmacy for the refill.) (Agent: If yes, when and what did the pharmacy advise?)  Preferred Pharmacy (with phone number or street name): [place]  Agent: Please be advised that RX refills may take up to 3 business days. We ask that you follow-up with your pharmacy.

## 2020-07-31 MED ORDER — CITALOPRAM HYDROBROMIDE 20 MG PO TABS: 20.0000 mg | ORAL_TABLET | Freq: Every day | ORAL | 0 refills | Status: DC

## 2020-08-01 ENCOUNTER — Encounter: Payer: Self-pay | Admitting: Dermatology

## 2020-08-09 DIAGNOSIS — Z8673 Personal history of transient ischemic attack (TIA), and cerebral infarction without residual deficits: Secondary | ICD-10-CM | POA: Diagnosis not present

## 2020-08-09 DIAGNOSIS — E042 Nontoxic multinodular goiter: Secondary | ICD-10-CM | POA: Diagnosis not present

## 2020-09-11 DIAGNOSIS — E042 Nontoxic multinodular goiter: Secondary | ICD-10-CM | POA: Diagnosis not present

## 2020-09-11 DIAGNOSIS — D352 Benign neoplasm of pituitary gland: Secondary | ICD-10-CM | POA: Diagnosis not present

## 2020-09-23 DIAGNOSIS — Z23 Encounter for immunization: Secondary | ICD-10-CM | POA: Diagnosis not present

## 2020-09-26 DIAGNOSIS — H539 Unspecified visual disturbance: Secondary | ICD-10-CM | POA: Diagnosis not present

## 2020-09-26 DIAGNOSIS — I10 Essential (primary) hypertension: Secondary | ICD-10-CM | POA: Diagnosis not present

## 2020-09-26 DIAGNOSIS — E042 Nontoxic multinodular goiter: Secondary | ICD-10-CM | POA: Diagnosis not present

## 2020-09-26 DIAGNOSIS — R42 Dizziness and giddiness: Secondary | ICD-10-CM | POA: Diagnosis not present

## 2020-10-11 ENCOUNTER — Encounter: Payer: BC Managed Care – PPO | Admitting: Family Medicine

## 2020-10-22 ENCOUNTER — Ambulatory Visit: Payer: BC Managed Care – PPO | Admitting: Family Medicine

## 2020-10-26 ENCOUNTER — Other Ambulatory Visit: Payer: Self-pay | Admitting: Family Medicine

## 2020-10-26 DIAGNOSIS — F3342 Major depressive disorder, recurrent, in full remission: Secondary | ICD-10-CM

## 2020-10-26 NOTE — Telephone Encounter (Signed)
Requested Prescriptions  Pending Prescriptions Disp Refills  . citalopram (CELEXA) 20 MG tablet [Pharmacy Med Name: CITALOPRAM 20MG  TABLETS] 4 tablet 0    Sig: TAKE 1 TABLET(20 MG) BY MOUTH DAILY     Psychiatry:  Antidepressants - SSRI Failed - 10/26/2020  9:27 AM      Failed - Completed PHQ-2 or PHQ-9 in the last 360 days      Failed - Valid encounter within last 6 months    Recent Outpatient Visits          1 year ago Annual physical exam   [place], [place]   1 year ago Controlled type 2 diabetes mellitus without complication, without long-term current use of insulin ([place])   [facility], Vickki Muff, [place]   2 years ago Essential hypertension   [facility], Dionne Bucy, [place]   2 years ago Essential hypertension   [place], Jeannette How, MD   3 years ago Essential hypertension   [place], Jeannette How, MD      Future Appointments            In 3 days [place], Vickki Muff, [place], [place]   In 2 weeks Ralene Bathe, MD [place]   In 5 months [place], Vickki Muff, [place], [place]

## 2020-10-29 ENCOUNTER — Other Ambulatory Visit: Payer: Self-pay

## 2020-10-29 ENCOUNTER — Encounter: Payer: Self-pay | Admitting: Family Medicine

## 2020-10-29 ENCOUNTER — Ambulatory Visit: Payer: BC Managed Care – PPO | Admitting: Family Medicine

## 2020-10-29 VITALS — BP 117/60 | HR 70 | Temp 97.8°F | Wt 188.0 lb

## 2020-10-29 DIAGNOSIS — E119 Type 2 diabetes mellitus without complications: Secondary | ICD-10-CM

## 2020-10-29 DIAGNOSIS — E782 Mixed hyperlipidemia: Secondary | ICD-10-CM | POA: Diagnosis not present

## 2020-10-29 DIAGNOSIS — I10 Essential (primary) hypertension: Secondary | ICD-10-CM | POA: Diagnosis not present

## 2020-10-29 DIAGNOSIS — F3342 Major depressive disorder, recurrent, in full remission: Secondary | ICD-10-CM

## 2020-10-29 DIAGNOSIS — C61 Malignant neoplasm of prostate: Secondary | ICD-10-CM

## 2020-10-29 DIAGNOSIS — E049 Nontoxic goiter, unspecified: Secondary | ICD-10-CM

## 2020-10-29 DIAGNOSIS — M4802 Spinal stenosis, cervical region: Secondary | ICD-10-CM

## 2020-10-29 DIAGNOSIS — E042 Nontoxic multinodular goiter: Secondary | ICD-10-CM | POA: Diagnosis not present

## 2020-10-29 MED ORDER — CITALOPRAM HYDROBROMIDE 20 MG PO TABS: ORAL_TABLET | ORAL | 6 refills | Status: DC

## 2020-10-29 MED ORDER — ATORVASTATIN CALCIUM 40 MG PO TABS: 40.0000 mg | ORAL_TABLET | Freq: Every day | ORAL | 12 refills | Status: AC

## 2020-10-29 MED ORDER — GABAPENTIN 300 MG PO CAPS: ORAL_CAPSULE | ORAL | 6 refills | Status: AC

## 2020-10-29 NOTE — Progress Notes (Signed)
Established patient visit   Patient: William Sexton   DOB: 1956/03/20   64 y.o. Male  MRN: [id number] Visit Date: 10/29/2020  Today's healthcare provider: Vernie Murders, PA   No chief complaint on file.  Subjective    HPI   Hypertension, follow-up  BP Readings from Last 3 Encounters:  10/29/20 117/60  10/10/19 120/80  06/02/19 122/77   Wt Readings from Last 3 Encounters:  10/29/20 188 lb (85.3 kg)  10/10/19 191 lb (86.6 kg)  06/02/19 186 lb 9.6 oz (84.6 kg)     He was last seen for hypertension 12 months ago.  BP at that visit was 120/80. Management since that visit includes none.  He reports good compliance with treatment. He is not having side effects.  He is following a Regular diet. He is not exercising. He does not smoke.  Use of agents associated with hypertension: none.   Outside blood pressures are not being checked. Symptoms: No chest pain No chest pressure  No palpitations No syncope  No dyspnea No orthopnea  No paroxysmal nocturnal dyspnea No lower extremity edema   Pertinent labs: Lab Results  Component Value Date   CHOL 146 10/10/2019   HDL 52 10/10/2019   LDLCALC 80 10/10/2019   TRIG 73 10/10/2019   CHOLHDL 2.8 10/10/2019   Lab Results  Component Value Date   NA 139 10/10/2019   K 4.4 10/10/2019   CREATININE 0.80 10/10/2019   GFRNONAA 96 10/10/2019   GFRAA 111 10/10/2019   GLUCOSE 105 (H) 10/10/2019     The 10-year ASCVD risk score Mikey Bussing DC Jr., et al., 2013) is: 15.5%   --------------------------------------------------------------------------------------------------- Diabetes Mellitus Type II, Follow-up  Lab Results  Component Value Date   HGBA1C 6.1 (H) 10/10/2019   HGBA1C 6.4 (H) 06/08/2019   HGBA1C 6.3 (H) 10/06/2018   Wt Readings from Last 3 Encounters:  10/29/20 188 lb (85.3 kg)  10/10/19 191 lb (86.6 kg)  06/02/19 186 lb 9.6 oz (84.6 kg)   Last seen for diabetes 1 years ago.  Management since then includes  none. He reports good compliance with treatment. He is not having side effects.  Symptoms: No fatigue No foot ulcerations  No appetite changes No nausea  No paresthesia of the feet  No polydipsia  No polyuria No visual disturbances   No vomiting     Home blood sugar records:not being checked  Episodes of hypoglycemia? No    Current insulin regiment: none Most Recent Eye Exam: less than 1 year   Pertinent Labs: Lab Results  Component Value Date   CHOL 146 10/10/2019   HDL 52 10/10/2019   LDLCALC 80 10/10/2019   TRIG 73 10/10/2019   CHOLHDL 2.8 10/10/2019   Lab Results  Component Value Date   NA 139 10/10/2019   K 4.4 10/10/2019   CREATININE 0.80 10/10/2019   GFRNONAA 96 10/10/2019   GFRAA 111 10/10/2019   GLUCOSE 105 (H) 10/10/2019     --------------------------------------------------------------------------------------------------- Lipid/Cholesterol, Follow-up  Last lipid panel Other pertinent labs  Lab Results  Component Value Date   CHOL 146 10/10/2019   HDL 52 10/10/2019   LDLCALC 80 10/10/2019   TRIG 73 10/10/2019   CHOLHDL 2.8 10/10/2019   Lab Results  Component Value Date   ALT 35 10/10/2019   AST 28 10/10/2019   PLT 190 10/10/2019   TSH 0.925 10/10/2019     He was last seen for this 12 months ago.  Management since that visit includes  none  He reports good compliance with treatment. He is not having side effects.   Symptoms: No chest pain No chest pressure/discomfort  No dyspnea No lower extremity edema  No numbness or tingling of extremity No orthopnea  No palpitations No paroxysmal nocturnal dyspnea  No speech difficulty No syncope   The 10-year ASCVD risk score Mikey Bussing DC Brooke Bonito., et al., 2013) is: 15.5%  --------------------------------------------------------------------------------------------------- Past Medical History:  Diagnosis Date  . Allergy   . Anxiety   . Arthritis   . Cerebral concussion   . Chronic back pain   .  Depression   . Diabetes mellitus without complication ([place])    Pt takes Metformin.  Marland Kitchen Hyperlipidemia   . Hypertension   . Prostate cancer ([place])   . Sleep apnea   . Spinal stenosis   . Thyroid goiter    Past Surgical History:  Procedure Laterality Date  . CERVICAL DISCECTOMY  05/26/2017  . COLONOSCOPY WITH PROPOFOL N/A 01/02/2019   Procedure: COLONOSCOPY WITH PROPOFOL;  Surgeon: Jonathon Bellows, MD;  Location: [facility] ENDOSCOPY;  Service: Gastroenterology;  Laterality: N/A;  . FRACTURE SURGERY    . L1-2 Fusion    . PROSTATE SURGERY    . SPINE SURGERY     Family History  Problem Relation Age of Onset  . Hyperlipidemia Father   . Hypertension Father   . Heart disease Father 52  . Macular degeneration Mother   . Breast cancer Sister 73  . Diabetes Maternal Grandmother   . Macular degeneration Maternal Grandmother   . Heart disease Maternal Grandfather   . Heart disease Paternal Grandfather 35  . COPD Neg Hx   . Stroke Neg Hx    Allergies  Allergen Reactions  . Other Other (See Comments)    Environmental/seasonal    Medications: Outpatient Medications Prior to Visit  Medication Sig  . acetaminophen (TYLENOL) 325 MG tablet Take 650 mg by mouth.  Marland Kitchen atorvastatin (LIPITOR) 40 MG tablet Take 1 tablet (40 mg total) by mouth daily.  . celecoxib (CELEBREX) 100 MG capsule Take 1-2 tablets a day  . citalopram (CELEXA) 20 MG tablet TAKE 1 TABLET(20 MG) BY MOUTH DAILY  . fluticasone (FLONASE) 50 MCG/ACT nasal spray   . gabapentin (NEURONTIN) 300 MG capsule TAKE 1 CAPSULE(300 MG) BY MOUTH THREE TIMES DAILY  . lisinopril (ZESTRIL) 20 MG tablet Take 1 tablet (20 mg total) by mouth daily.  . tamsulosin (FLOMAX) 0.4 MG CAPS capsule Take 1 capsule (0.4 mg total) by mouth daily.   No facility-administered medications prior to visit.    Review of Systems  Constitutional: Negative.   HENT: Negative.   Eyes: Negative.   Respiratory: Negative.   Cardiovascular: Negative.   Genitourinary:  Negative.   Neurological: Positive for numbness.       Both great toes.       Objective    BP 117/60 (BP Location: Right Arm, Patient Position: Sitting, Cuff Size: Normal)   Pulse 70   Temp 97.8 F (36.6 C) (Oral)   Wt 188 lb (85.3 kg)   SpO2 98%   BMI 26.98 kg/m   BP Readings from Last 3 Encounters:  10/29/20 117/60  10/10/19 120/80  06/02/19 122/77   Wt Readings from Last 3 Encounters:  10/29/20 188 lb (85.3 kg)  10/10/19 191 lb (86.6 kg)  06/02/19 186 lb 9.6 oz (84.6 kg)   Physical Exam Constitutional:      Appearance: He is well-developed.  HENT:     Head: Normocephalic and  atraumatic.     Right Ear: External ear normal.     Left Ear: External ear normal.     Nose: Nose normal.  Eyes:     General:        Right eye: No discharge.     Conjunctiva/sclera: Conjunctivae normal.     Pupils: Pupils are equal, round, and reactive to light.  Neck:     Thyroid: No thyromegaly.     Trachea: No tracheal deviation.     Comments: Enlarged thyroid with nodules (L>R). Cardiovascular:     Rate and Rhythm: Normal rate and regular rhythm.     Heart sounds: Normal heart sounds. No murmur heard.   Pulmonary:     Effort: Pulmonary effort is normal. No respiratory distress.     Breath sounds: Normal breath sounds. No wheezing or rales.  Chest:     Chest wall: No tenderness.  Abdominal:     General: There is no distension.     Palpations: Abdomen is soft. There is no mass.     Tenderness: There is no abdominal tenderness. There is no guarding or rebound.  Genitourinary:    Comments: Deferred to urologist. Musculoskeletal:        General: No tenderness. Normal range of motion.     Cervical back: Normal range of motion and neck supple.  Lymphadenopathy:     Cervical: No cervical adenopathy.  Skin:    General: Skin is warm and dry.     Findings: No erythema or rash.  Neurological:     Mental Status: He is alert and oriented to person, place, and time.     Cranial Nerves:  No cranial nerve deficit.     Motor: No abnormal muscle tone.     Coordination: Coordination normal.     Deep Tendon Reflexes: Reflexes are normal and symmetric. Reflexes normal.     Comments: Symmetric but diminished DTR's.  Psychiatric:        Behavior: Behavior normal.        Thought Content: Thought content normal.        Judgment: Judgment normal.     Diabetic Foot Exam - Simple   Simple Foot Form Diabetic Foot exam was performed with the following findings: Yes 10/29/2020  8:50 AM  Visual Inspection No deformities, no ulcerations, no other skin breakdown bilaterally: Yes Sensation Testing See comments: Yes Pulse Check Posterior Tibialis and Dorsalis pulse intact bilaterally: Yes Comments Decreased sensation in both great toes.     No results found for any visits on 10/29/20.  Assessment & Plan     1. Primary hypertension BP well controlled by Lisinopril 20 mg qd. Denies chest pains or palpitations. Brings in a report from the [facility] with Zio patch showing some ventricular trigeminy, supraventricular tachycardia and normal echocardiogram in September 2021. They will continue to follow up. Recheck routine labs and present statin with ACE-I dosage. Advised to take a Baby ASA daily. - CBC with Differential/Platelet - Comprehensive metabolic panel - Lipid Panel With LDL/HDL Ratio - TSH  2. Controlled type 2 diabetes mellitus without complication, without long-term current use of insulin ([place]) States he has been off the diabetic diet that normally controls blood sugar without additional medications. Recheck blood levels. Some numbness in great toes. Unsure if this is from diabetic neuropathy or spinal stenosis. - CBC with Differential/Platelet - Comprehensive metabolic panel - Hemoglobin A1c - Lipid Panel With LDL/HDL Ratio  3. Mixed hyperlipidemia Tolerating the Lipitor 40 mg qd.  Recheck labs and refill meds. Denies side effects. - Comprehensive metabolic  panel - Lipid Panel With LDL/HDL Ratio - TSH - atorvastatin (LIPITOR) 40 MG tablet; Take 1 tablet (40 mg total) by mouth daily.  Dispense: 30 tablet; Refill: 12  4. Thyroid goiter Goiter and nodules seem to be increasing. [facility]. Will recheck blood levels. - CBC with Differential/Platelet - TSH - T4  5. Recurrent major depressive disorder, in full remission ([place]) Sleeping fairly well but unable to use CPAP. Still has some episodes of irritability and anger. Always better with using Celexa regularly. Refill meds and recheck labs. - CBC with Differential/Platelet - Comprehensive metabolic panel - TSH - citalopram (CELEXA) 20 MG tablet; TAKE 1 TABLET(20 MG) BY MOUTH DAILY  Dispense: 30 tablet; Refill: 6  6. Prostate cancer ([facility]) Feeling urine flow is stable without concerns since Dr. Rogers Blocker (urologist) implanted radioactive seeds. Continue follow up with him.  7. Cervical spinal stenosis Stable and discomfort controlled with Celebrex and Gabapentin. - gabapentin (NEURONTIN) 300 MG capsule; TAKE 1 CAPSULE(300 MG) BY MOUTH THREE TIMES DAILY  Dispense: 90 capsule; Refill: 6    No follow-ups on file.      Andres Shad, PA, have reviewed all documentation for this visit. The documentation on 10/29/20 for the exam, diagnosis, procedures, and orders are all accurate and complete.    Vernie Murders, [place] [phone] (phone) [phone] (fax)  [place]

## 2020-10-30 LAB — CBC WITH DIFFERENTIAL/PLATELET
Basophils Absolute: 0 10*3/uL (ref 0.0–0.2)
Basos: 1 %
EOS (ABSOLUTE): 0.1 10*3/uL (ref 0.0–0.4)
Eos: 2 %
Hematocrit: 46.7 % (ref 37.5–51.0)
Hemoglobin: 15.7 g/dL (ref 13.0–17.7)
Immature Grans (Abs): 0 10*3/uL (ref 0.0–0.1)
Immature Granulocytes: 0 %
Lymphocytes Absolute: 1.4 10*3/uL (ref 0.7–3.1)
Lymphs: 32 %
MCH: 30.8 pg (ref 26.6–33.0)
MCHC: 33.6 g/dL (ref 31.5–35.7)
MCV: 92 fL (ref 79–97)
Monocytes Absolute: 0.6 10*3/uL (ref 0.1–0.9)
Monocytes: 15 %
Neutrophils Absolute: 2.2 10*3/uL (ref 1.4–7.0)
Neutrophils: 50 %
Platelets: 199 10*3/uL (ref 150–450)
RBC: 5.09 x10E6/uL (ref 4.14–5.80)
RDW: 12.3 % (ref 11.6–15.4)
WBC: 4.4 10*3/uL (ref 3.4–10.8)

## 2020-10-30 LAB — COMPREHENSIVE METABOLIC PANEL
ALT: 29 IU/L (ref 0–44)
AST: 23 IU/L (ref 0–40)
Albumin/Globulin Ratio: 2.2 (ref 1.2–2.2)
Albumin: 4.6 g/dL (ref 3.8–4.8)
Alkaline Phosphatase: 71 IU/L (ref 44–121)
BUN/Creatinine Ratio: 12 (ref 10–24)
BUN: 11 mg/dL (ref 8–27)
Bilirubin Total: 0.5 mg/dL (ref 0.0–1.2)
CO2: 27 mmol/L (ref 20–29)
Calcium: 9.8 mg/dL (ref 8.6–10.2)
Chloride: 101 mmol/L (ref 96–106)
Creatinine, Ser: 0.92 mg/dL (ref 0.76–1.27)
GFR calc Af Amer: 102 mL/min/{1.73_m2} (ref >59)
GFR calc non Af Amer: 88 mL/min/{1.73_m2} (ref >59)
Globulin, Total: 2.1 g/dL (ref 1.5–4.5)
Glucose: 100 mg/dL — ABNORMAL HIGH (ref 65–99)
Potassium: 5.5 mmol/L — ABNORMAL HIGH (ref 3.5–5.2)
Sodium: 141 mmol/L (ref 134–144)
Total Protein: 6.7 g/dL (ref 6.0–8.5)

## 2020-10-30 LAB — HEMOGLOBIN A1C
Est. average glucose Bld gHb Est-mCnc: 137 mg/dL
Hgb A1c MFr Bld: 6.4 % — ABNORMAL HIGH (ref 4.8–5.6)

## 2020-10-30 LAB — LIPID PANEL WITH LDL/HDL RATIO
Cholesterol, Total: 168 mg/dL (ref 100–199)
HDL: 56 mg/dL (ref >39)
LDL Chol Calc (NIH): 93 mg/dL (ref 0–99)
LDL/HDL Ratio: 1.7 ratio (ref 0.0–3.6)
Triglycerides: 107 mg/dL (ref 0–149)
VLDL Cholesterol Cal: 19 mg/dL (ref 5–40)

## 2020-10-30 LAB — TSH: TSH: 0.74 u[IU]/mL (ref 0.450–4.500)

## 2020-10-30 LAB — T4: T4, Total: 6.5 ug/dL (ref 4.5–12.0)

## 2020-11-01 ENCOUNTER — Telehealth: Payer: Self-pay

## 2020-11-01 NOTE — Telephone Encounter (Signed)
Copied from [place] [id number]. Topic: General - Call Back - No Documentation >> Nov 01, 2020 10:29 AM Hinda Lenis D wrote: PT returning the call / no voicemail / please advise

## 2020-11-01 NOTE — Telephone Encounter (Signed)
Patient was notified of results. Expressed understanding.  

## 2020-11-11 ENCOUNTER — Ambulatory Visit: Payer: BC Managed Care – PPO | Admitting: Dermatology

## 2020-12-23 ENCOUNTER — Other Ambulatory Visit: Payer: Self-pay | Admitting: Family Medicine

## 2020-12-31 ENCOUNTER — Other Ambulatory Visit: Payer: Self-pay

## 2021-01-02 ENCOUNTER — Encounter: Payer: Self-pay | Admitting: Endocrinology

## 2021-01-02 ENCOUNTER — Other Ambulatory Visit: Payer: Self-pay

## 2021-01-02 ENCOUNTER — Ambulatory Visit (INDEPENDENT_AMBULATORY_CARE_PROVIDER_SITE_OTHER): Payer: BC Managed Care – PPO | Admitting: Endocrinology

## 2021-01-02 DIAGNOSIS — E042 Nontoxic multinodular goiter: Secondary | ICD-10-CM | POA: Insufficient documentation

## 2021-01-02 NOTE — Progress Notes (Signed)
Subjective:    Patient ID: William Sexton, male    DOB: 1956/08/10, 65 y.o.   MRN: [id number]  HPI Pt is referred by Dr B-B, for nodular thyroid.  Pt was noted to have a thyroid nodule in 2017.  bx then was benign.  He has no h/o XRT or surgery to the neck.  Pt says the goiter is enlarging.   Past Medical History:  Diagnosis Date  . Allergy   . Anxiety   . Arthritis   . Cerebral concussion   . Chronic back pain   . Depression   . Diabetes mellitus without complication ([place])    Pt takes Metformin.  Marland Kitchen Hyperlipidemia   . Hypertension   . Prostate cancer ([place])   . Sleep apnea   . Spinal stenosis   . Thyroid goiter     Past Surgical History:  Procedure Laterality Date  . CERVICAL DISCECTOMY  05/26/2017  . COLONOSCOPY WITH PROPOFOL N/A 01/02/2019   Procedure: COLONOSCOPY WITH PROPOFOL;  Surgeon: Jonathon Bellows, MD;  Location: [facility] ENDOSCOPY;  Service: Gastroenterology;  Laterality: N/A;  . FRACTURE SURGERY    . L1-2 Fusion    . PROSTATE SURGERY    . SPINE SURGERY      Social History   Socioeconomic History  . Marital status: Single    Spouse name: Not on file  . Number of children: Not on file  . Years of education: Not on file  . Highest education level: Not on file  Occupational History  . Not on file  Tobacco Use  . Smoking status: Former Smoker    Packs/day: 1.00    Years: 40.00    Pack years: 40.00    Types: Cigarettes    Quit date: 08/08/2011    Years since quitting: 9.4  . Smokeless tobacco: Never Used  Vaping Use  . Vaping Use: Never used  Substance and Sexual Activity  . Alcohol use: No    Alcohol/week: 0.0 standard drinks  . Drug use: No  . Sexual activity: Not on file  Other Topics Concern  . Not on file  Social History Narrative  . Not on file   Social Determinants of Health   Financial Resource Strain: Not on file  Food Insecurity: Not on file  Transportation Needs: Not on file  Physical Activity: Not on file  Stress: Not on file  Social  Connections: Not on file  Intimate Partner Violence: Not on file    Current Outpatient Medications on File Prior to Visit  Medication Sig Dispense Refill  . acetaminophen (TYLENOL) 325 MG tablet Take 650 mg by mouth.    Marland Kitchen atorvastatin (LIPITOR) 40 MG tablet Take 1 tablet (40 mg total) by mouth daily. 30 tablet 12  . celecoxib (CELEBREX) 100 MG capsule TAKE 1 TO 2 CAPSULES BY MOUTH DAILY 60 capsule 3  . citalopram (CELEXA) 20 MG tablet TAKE 1 TABLET(20 MG) BY MOUTH DAILY 30 tablet 6  . fluticasone (FLONASE) 50 MCG/ACT nasal spray     . gabapentin (NEURONTIN) 300 MG capsule TAKE 1 CAPSULE(300 MG) BY MOUTH THREE TIMES DAILY 90 capsule 6  . lisinopril (ZESTRIL) 20 MG tablet Take 1 tablet (20 mg total) by mouth daily. 30 tablet 12  . Multiple Vitamin (MULTIVITAMIN) capsule Take 1 capsule by mouth daily.    . Omega-3 Fatty Acids (FISH OIL) 1200 MG CAPS Take by mouth.    . tamsulosin (FLOMAX) 0.4 MG CAPS capsule Take 1 capsule (0.4 mg total) by mouth daily.  90 capsule 1   No current facility-administered medications on file prior to visit.    Allergies  Allergen Reactions  . Other Other (See Comments)    Environmental/seasonal    Family History  Problem Relation Age of Onset  . Hyperlipidemia Father   . Hypertension Father   . Heart disease Father 19  . Macular degeneration Mother   . Breast cancer Sister 59  . Hyperthyroidism Sister   . Diabetes Maternal Grandmother   . Macular degeneration Maternal Grandmother   . Heart disease Maternal Grandfather   . Heart disease Paternal Grandfather 83  . COPD Neg Hx   . Stroke Neg Hx     BP 120/76   Pulse 64   Ht 5\' 10"  (1.778 m)   Wt 187 lb (84.8 kg)   SpO2 97%   BMI 26.83 kg/m    Review of Systems Denies hoarseness, neck pain, and sob.      Objective:   Physical Exam VITAL SIGNS:  See vs page.   GENERAL: no distress.   NECK: thyroid is several times normal size (R>L), but I can't tell details.     Lab Results  Component  Value Date   TSH 0.740 10/29/2020   T4TOTAL 6.5 10/29/2020   [place]: (9/21): RLP nodule has increased from 24 to 28 mm since 2018  I have reviewed outside records, and summarized: Pt was noted to have goiter, and referred here.  DM and SVT were also addressed.      Assessment & Plan:  Thyroid nodule, new to me.  uncertain etiology and prognosis.   Patient Instructions  Let's recheck the biopsy, guided by the  ultrasound.  you will receive a phone call, about a day and time for an appointment. If no cancer is found, Please come back for a follow-up appointment in 1 year. most of the time, a "lumpy thyroid" will eventually become overactive.  this is usually a slow process, happening over the span of many years.

## 2021-01-02 NOTE — Patient Instructions (Signed)
Let's recheck the biopsy, guided by the  ultrasound.  you will receive a phone call, about a day and time for an appointment. If no cancer is found, Please come back for a follow-up appointment in 1 year. most of the time, a "lumpy thyroid" will eventually become overactive.  this is usually a slow process, happening over the span of many years.

## 2021-01-08 DIAGNOSIS — F339 Major depressive disorder, recurrent, unspecified: Secondary | ICD-10-CM | POA: Diagnosis not present

## 2021-01-31 DIAGNOSIS — N310 Uninhibited neuropathic bladder, not elsewhere classified: Secondary | ICD-10-CM | POA: Diagnosis not present

## 2021-01-31 DIAGNOSIS — C61 Malignant neoplasm of prostate: Secondary | ICD-10-CM | POA: Diagnosis not present

## 2021-01-31 DIAGNOSIS — N401 Enlarged prostate with lower urinary tract symptoms: Secondary | ICD-10-CM | POA: Diagnosis not present

## 2021-01-31 DIAGNOSIS — D400 Neoplasm of uncertain behavior of prostate: Secondary | ICD-10-CM | POA: Diagnosis not present

## 2021-02-12 DIAGNOSIS — R918 Other nonspecific abnormal finding of lung field: Secondary | ICD-10-CM | POA: Diagnosis not present

## 2021-02-21 DIAGNOSIS — Z87891 Personal history of nicotine dependence: Secondary | ICD-10-CM | POA: Diagnosis not present

## 2021-02-21 DIAGNOSIS — R918 Other nonspecific abnormal finding of lung field: Secondary | ICD-10-CM | POA: Diagnosis not present

## 2021-03-12 DIAGNOSIS — J301 Allergic rhinitis due to pollen: Secondary | ICD-10-CM | POA: Diagnosis not present

## 2021-03-12 DIAGNOSIS — R040 Epistaxis: Secondary | ICD-10-CM | POA: Diagnosis not present

## 2021-03-17 DIAGNOSIS — Z6826 Body mass index (BMI) 26.0-26.9, adult: Secondary | ICD-10-CM | POA: Diagnosis not present

## 2021-03-17 DIAGNOSIS — R002 Palpitations: Secondary | ICD-10-CM | POA: Diagnosis not present

## 2021-03-17 DIAGNOSIS — F419 Anxiety disorder, unspecified: Secondary | ICD-10-CM | POA: Diagnosis not present

## 2021-03-17 DIAGNOSIS — E663 Overweight: Secondary | ICD-10-CM | POA: Diagnosis not present

## 2021-03-17 DIAGNOSIS — R202 Paresthesia of skin: Secondary | ICD-10-CM | POA: Diagnosis not present

## 2021-03-17 DIAGNOSIS — G4733 Obstructive sleep apnea (adult) (pediatric): Secondary | ICD-10-CM | POA: Diagnosis not present

## 2021-03-17 DIAGNOSIS — I10 Essential (primary) hypertension: Secondary | ICD-10-CM | POA: Diagnosis not present

## 2021-03-17 DIAGNOSIS — I252 Old myocardial infarction: Secondary | ICD-10-CM | POA: Diagnosis not present

## 2021-03-17 DIAGNOSIS — F32A Depression, unspecified: Secondary | ICD-10-CM | POA: Diagnosis not present

## 2021-03-17 DIAGNOSIS — E119 Type 2 diabetes mellitus without complications: Secondary | ICD-10-CM | POA: Diagnosis not present

## 2021-03-17 DIAGNOSIS — E042 Nontoxic multinodular goiter: Secondary | ICD-10-CM | POA: Diagnosis not present

## 2021-03-17 DIAGNOSIS — R110 Nausea: Secondary | ICD-10-CM | POA: Diagnosis not present

## 2021-03-21 ENCOUNTER — Ambulatory Visit: Payer: Self-pay

## 2021-03-21 ENCOUNTER — Telehealth: Payer: Self-pay

## 2021-03-21 NOTE — Telephone Encounter (Signed)
I called patient and patient notified me that he would like to schedule an OV to discuss chest tightness and palpitations. Pt was originally scheduled for CPE with physical on April 11th so I informed patient of his provider, Simona Huh Chrismon's absence. Therefore, appt has been scheduled with Dr. Caryn Section on April 12th at 10am to get patient seen sooner.

## 2021-03-21 NOTE — Telephone Encounter (Signed)
Pt. States he went to [facility] Monday for palpitations. Had labs done. Pt. Concerned his "[facility] was 34.4". Palpitations last a "few seconds." Offered an appointment. Declines. States he has an appointment 03/31/21 and wants to see his his PCP sooner if possible. Please advise pt.  Answer Assessment - Initial Assessment Questions 1. DESCRIPTION: "Please describe your heart rate or heartbeat that you are having" (e.g., fast/slow, regular/irregular, skipped or extra beats, "palpitations")     Palpitations 2. ONSET: "When did it start?" (Minutes, hours or days)       1 week ago 3. DURATION: "How long does it last" (e.g., seconds, minutes, hours)     Seconds 4. PATTERN "Does it come and go, or has it been constant since it started?"  "Does it get worse with exertion?"   "Are you feeling it now?"     Comes and goes 5. TAP: "Using your hand, can you tap out what you are feeling on a chair or table in front of you, so that I can hear?" (Note: not all patients can do this)       No 6. HEART RATE: "Can you tell me your heart rate?" "How many beats in 15 seconds?"  (Note: not all patients can do this)       Unsure 7. RECURRENT SYMPTOM: "Have you ever had this before?" If Yes, ask: "When was the last time?" and "What happened that time?"      Yes 8. CAUSE: "What do you think is causing the palpitations?"     Unsure 9. CARDIAC HISTORY: "Do you have any history of heart disease?" (e.g., heart attack, angina, bypass surgery, angioplasty, arrhythmia)      Yes 10. OTHER SYMPTOMS: "Do you have any other symptoms?" (e.g., dizziness, chest pain, sweating, difficulty breathing)       Had chest pain last week 11. PREGNANCY: "Is there any chance you are pregnant?" "When was your last menstrual period?"       n/a  Protocols used: HEART RATE AND HEARTBEAT QUESTIONS-A-AH

## 2021-03-24 DIAGNOSIS — R000 Tachycardia, unspecified: Secondary | ICD-10-CM | POA: Diagnosis not present

## 2021-03-31 ENCOUNTER — Encounter: Payer: BC Managed Care – PPO | Admitting: Family Medicine

## 2021-04-01 ENCOUNTER — Ambulatory Visit: Payer: Self-pay | Admitting: Family Medicine

## 2021-04-10 DIAGNOSIS — F339 Major depressive disorder, recurrent, unspecified: Secondary | ICD-10-CM | POA: Diagnosis not present

## 2021-04-19 ENCOUNTER — Other Ambulatory Visit: Payer: Self-pay | Admitting: Family Medicine

## 2021-04-19 ENCOUNTER — Encounter: Payer: Self-pay | Admitting: *Deleted

## 2021-04-19 DIAGNOSIS — I10 Essential (primary) hypertension: Secondary | ICD-10-CM

## 2021-04-19 NOTE — Telephone Encounter (Signed)
Patient overdue appointment- left message on MyChart- and message sent with Rx. Courtesy RF #[id number]

## 2021-05-01 ENCOUNTER — Encounter: Payer: Self-pay | Admitting: Family Medicine

## 2021-05-01 ENCOUNTER — Other Ambulatory Visit: Payer: Self-pay

## 2021-05-01 ENCOUNTER — Ambulatory Visit (INDEPENDENT_AMBULATORY_CARE_PROVIDER_SITE_OTHER): Payer: BC Managed Care – PPO | Admitting: Family Medicine

## 2021-05-01 VITALS — BP 127/62 | HR 55 | Temp 97.9°F | Resp 18 | Wt 185.0 lb

## 2021-05-01 DIAGNOSIS — E119 Type 2 diabetes mellitus without complications: Secondary | ICD-10-CM | POA: Diagnosis not present

## 2021-05-01 LAB — POCT GLYCOSYLATED HEMOGLOBIN (HGB A1C)
Est. average glucose Bld gHb Est-mCnc: 143
Hemoglobin A1C: 6.6 % — AB (ref 4.0–5.6)

## 2021-05-01 NOTE — Progress Notes (Signed)
Established patient visit   Patient: William Sexton   DOB: 09-03-1956   65 y.o. Male  MRN: [id number] Visit Date: 05/01/2021  Today's healthcare provider: Vernie Murders, PA-C   Chief Complaint  Patient presents with  . Hypertension  . Prediabetes   Subjective    HPI  Hypertension, follow-up  BP Readings from Last 3 Encounters:  05/01/21 127/62  01/02/21 120/76  10/29/20 117/60   Wt Readings from Last 3 Encounters:  05/01/21 185 lb (83.9 kg)  01/02/21 187 lb (84.8 kg)  10/29/20 188 lb (85.3 kg)     He was last seen for hypertension on 10/29/2020.  BP at that visit was 117/60. Management since that visit includes continue follow up with [facility].  He reports good compliance with treatment. He is not having side effects.  He is following a Regular diet. He is not exercising. He does not smoke.  Use of agents associated with hypertension: none.   Outside blood pressures are not checked. Symptoms: Yes chest pain Yes chest pressure  No palpitations No syncope  No dyspnea No orthopnea  No paroxysmal nocturnal dyspnea No lower extremity edema   Pertinent labs: Lab Results  Component Value Date   CHOL 168 10/29/2020   HDL 56 10/29/2020   LDLCALC 93 10/29/2020   TRIG 107 10/29/2020   CHOLHDL 2.8 10/10/2019   Lab Results  Component Value Date   NA 141 10/29/2020   K 5.5 (H) 10/29/2020   CREATININE 0.92 10/29/2020   GFRNONAA 88 10/29/2020   GFRAA 102 10/29/2020   GLUCOSE 100 (H) 10/29/2020     The 10-year ASCVD risk score Mikey Bussing DC Jr., et al., 2013) is: 28.2%   ---------------------------------------------------------------------------------------------------  Prediabetes, Follow-up  Lab Results  Component Value Date   HGBA1C 6.6 (A) 05/01/2021   HGBA1C 6.4 (H) 10/29/2020   HGBA1C 6.1 (H) 10/10/2019   GLUCOSE 100 (H) 10/29/2020   GLUCOSE 105 (H) 10/10/2019   GLUCOSE 114 (H) 06/08/2019    Last seen for for this 6 months ago.   Management since that visit includes restarting diabetic diet and regular exercise 3-4 days a week for 30-40 minutes . Current symptoms include none and have been stable.  Prior visit with dietician: no Current diet: in general, an "unhealthy" diet Current exercise: none  Pertinent Labs:    Component Value Date/Time   CHOL 168 10/29/2020 0851   CHOL 152 08/08/2015 1036   TRIG 107 10/29/2020 0851   TRIG 187 (H) 08/08/2015 1036   CHOLHDL 2.8 10/10/2019 1024   CREATININE 0.92 10/29/2020 0851    Wt Readings from Last 3 Encounters:  05/01/21 185 lb (83.9 kg)  01/02/21 187 lb (84.8 kg)  10/29/20 188 lb (85.3 kg)    -----------------------------------------------------------------------------------------  Past Medical History:  Diagnosis Date  . Allergy   . Anxiety   . Arthritis   . Cerebral concussion   . Chronic back pain   . Depression   . Diabetes mellitus without complication ([place])    Pt takes Metformin.  Marland Kitchen Hyperlipidemia   . Hypertension   . Prostate cancer ([place])   . Sleep apnea   . Spinal stenosis   . Thyroid goiter    Past Surgical History:  Procedure Laterality Date  . CERVICAL DISCECTOMY  05/26/2017  . COLONOSCOPY WITH PROPOFOL N/A 01/02/2019   Procedure: COLONOSCOPY WITH PROPOFOL;  Surgeon: Jonathon Bellows, MD;  Location: [facility] ENDOSCOPY;  Service: Gastroenterology;  Laterality: N/A;  . FRACTURE SURGERY    .  L1-2 Fusion    . PROSTATE SURGERY    . SPINE SURGERY     Social History   Tobacco Use  . Smoking status: Former Smoker    Packs/day: 1.00    Years: 40.00    Pack years: 40.00    Types: Cigarettes    Quit date: 08/08/2011    Years since quitting: 9.7  . Smokeless tobacco: Never Used  Vaping Use  . Vaping Use: Never used  Substance Use Topics  . Alcohol use: No    Alcohol/week: 0.0 standard drinks  . Drug use: No   Family History  Problem Relation Age of Onset  . Hyperlipidemia Father   . Hypertension Father   . Heart disease Father 59  .  Macular degeneration Mother   . Breast cancer Sister 43  . Hyperthyroidism Sister   . Diabetes Maternal Grandmother   . Macular degeneration Maternal Grandmother   . Heart disease Maternal Grandfather   . Heart disease Paternal Grandfather 73  . COPD Neg Hx   . Stroke Neg Hx    Allergies  Allergen Reactions  . Other Other (See Comments)    Environmental/seasonal       Medications: Outpatient Medications Prior to Visit  Medication Sig  . acetaminophen (TYLENOL) 325 MG tablet Take 650 mg by mouth.  Marland Kitchen atorvastatin (LIPITOR) 40 MG tablet Take 1 tablet (40 mg total) by mouth daily.  . celecoxib (CELEBREX) 100 MG capsule TAKE 1 TO 2 CAPSULES BY MOUTH DAILY  . citalopram (CELEXA) 20 MG tablet TAKE 1 TABLET(20 MG) BY MOUTH DAILY  . fluticasone (FLONASE) 50 MCG/ACT nasal spray   . gabapentin (NEURONTIN) 300 MG capsule TAKE 1 CAPSULE(300 MG) BY MOUTH THREE TIMES DAILY  . lisinopril (ZESTRIL) 20 MG tablet TAKE 1 TABLET(20 MG) BY MOUTH DAILY  . Multiple Vitamin (MULTIVITAMIN) capsule Take 1 capsule by mouth daily.  . tamsulosin (FLOMAX) 0.4 MG CAPS capsule Take 1 capsule (0.4 mg total) by mouth daily.  . [DISCONTINUED] Omega-3 Fatty Acids (FISH OIL) 1200 MG CAPS Take by mouth.   No facility-administered medications prior to visit.    Review of Systems  Constitutional: Negative for appetite change, chills and fever.  Respiratory: Negative for chest tightness, shortness of breath and wheezing.   Cardiovascular: Positive for chest pain. Negative for palpitations.  Gastrointestinal: Negative for abdominal pain, nausea and vomiting.  Musculoskeletal: Positive for arthralgias (left hand).  Neurological:       Numbness in toes       Objective    BP 127/62 (BP Location: Right Arm, Patient Position: Sitting, Cuff Size: Normal)   Pulse (!) 55   Temp 97.9 F (36.6 C) (Temporal)   Resp 18   Wt 185 lb (83.9 kg)   BMI 26.54 kg/m     Physical Exam Constitutional:      General: He is  not in acute distress.    Appearance: He is well-developed.  HENT:     Head: Normocephalic and atraumatic.     Right Ear: Hearing normal.     Left Ear: Hearing normal.     Nose: Nose normal.  Eyes:     General: Lids are normal. No scleral icterus.       Right eye: No discharge.        Left eye: No discharge.     Conjunctiva/sclera: Conjunctivae normal.  Cardiovascular:     Rate and Rhythm: Normal rate and regular rhythm.     Heart sounds: Normal heart sounds.  Pulmonary:     Effort: Pulmonary effort is normal. No respiratory distress.  Musculoskeletal:        General: Normal range of motion.     Cervical back: Normal range of motion and neck supple.  Skin:    Findings: No lesion or rash.  Neurological:     Mental Status: He is alert and oriented to person, place, and time.  Psychiatric:        Speech: Speech normal.        Behavior: Behavior normal.        Thought Content: Thought content normal.       Results for orders placed or performed in visit on 05/01/21  POCT HgB A1C  Result Value Ref Range   Hemoglobin A1C 6.6 (A) 4.0 - 5.6 %   Est. average glucose Bld gHb Est-mCnc 143     Assessment & Plan     1. Controlled type 2 diabetes mellitus without complication, without long-term current use of insulin ([place]) Has not started a low fat diabetic diet with regular exercise. Recheck in 3 months. With Hgb A1C at 6.6 this time, may need Metformin. Requests postponing starting medications. Will get on life style changes first. - POCT HgB A1C   No follow-ups on file.      I, Tanda Morrissey, PA-C, have reviewed all documentation for this visit. The documentation on 05/01/21 for the exam, diagnosis, procedures, and orders are all accurate and complete.    Vernie Murders, PA-C  [employer] [phone] (phone) [phone] (fax)  [place]

## 2021-05-12 DIAGNOSIS — Z0389 Encounter for observation for other suspected diseases and conditions ruled out: Secondary | ICD-10-CM | POA: Diagnosis not present

## 2021-05-12 DIAGNOSIS — E119 Type 2 diabetes mellitus without complications: Secondary | ICD-10-CM | POA: Diagnosis not present

## 2021-05-12 DIAGNOSIS — I10 Essential (primary) hypertension: Secondary | ICD-10-CM | POA: Diagnosis not present

## 2021-05-12 DIAGNOSIS — I472 Ventricular tachycardia: Secondary | ICD-10-CM | POA: Diagnosis not present

## 2021-05-12 DIAGNOSIS — E785 Hyperlipidemia, unspecified: Secondary | ICD-10-CM | POA: Diagnosis not present

## 2021-06-01 ENCOUNTER — Other Ambulatory Visit: Payer: Self-pay | Admitting: Family Medicine

## 2021-06-01 NOTE — Telephone Encounter (Signed)
Requested Prescriptions  Pending Prescriptions Disp Refills  . celecoxib (CELEBREX) 100 MG capsule [Pharmacy Med Name: CELECOXIB 100MG  CAPSULES] 60 capsule 0    Sig: TAKE 1 TO 2 CAPSULES BY MOUTH DAILY     Analgesics:  COX2 Inhibitors Passed - 06/01/2021  2:18 PM      Passed - HGB in normal range and within 360 days    Hemoglobin  Date Value Ref Range Status  10/29/2020 15.7 13.0 - 17.7 g/dL Final         Passed - Cr in normal range and within 360 days    Creatinine, Ser  Date Value Ref Range Status  10/29/2020 0.92 0.76 - 1.27 mg/dL Final         Passed - Patient is not pregnant      Passed - Valid encounter within last 12 months    Recent Outpatient Visits          1 month ago Controlled type 2 diabetes mellitus without complication, without long-term current use of insulin ([place])   [employer], Vickki Muff, PA-C   7 months ago Primary hypertension   [employer], Vickki Muff, PA-C   1 year ago Annual physical exam   [place], PA-C   2 years ago Controlled type 2 diabetes mellitus without complication, without long-term current use of insulin ([place])   [employer] [facility], Vickki Muff, PA-C   2 years ago Essential hypertension   [facility] [facility], Dionne Bucy, MD      Future Appointments            In 2 months [place], Vickki Muff, PA-C [employer], [place]

## 2021-06-08 ENCOUNTER — Other Ambulatory Visit: Payer: Self-pay | Admitting: Family Medicine

## 2021-06-08 NOTE — Telephone Encounter (Signed)
last RF 06/01/21 #60

## 2021-07-11 DIAGNOSIS — I472 Ventricular tachycardia: Secondary | ICD-10-CM | POA: Diagnosis not present

## 2021-07-28 DIAGNOSIS — E042 Nontoxic multinodular goiter: Secondary | ICD-10-CM | POA: Diagnosis not present

## 2021-08-01 ENCOUNTER — Ambulatory Visit: Payer: BC Managed Care – PPO | Admitting: Family Medicine

## 2021-08-08 DIAGNOSIS — D400 Neoplasm of uncertain behavior of prostate: Secondary | ICD-10-CM | POA: Diagnosis not present

## 2021-08-08 DIAGNOSIS — N310 Uninhibited neuropathic bladder, not elsewhere classified: Secondary | ICD-10-CM | POA: Diagnosis not present

## 2021-08-08 DIAGNOSIS — C61 Malignant neoplasm of prostate: Secondary | ICD-10-CM | POA: Diagnosis not present

## 2021-08-08 DIAGNOSIS — N401 Enlarged prostate with lower urinary tract symptoms: Secondary | ICD-10-CM | POA: Diagnosis not present

## 2021-08-11 DIAGNOSIS — C61 Malignant neoplasm of prostate: Secondary | ICD-10-CM | POA: Diagnosis not present

## 2021-08-11 DIAGNOSIS — D400 Neoplasm of uncertain behavior of prostate: Secondary | ICD-10-CM | POA: Diagnosis not present

## 2021-08-21 ENCOUNTER — Other Ambulatory Visit: Payer: Self-pay

## 2021-08-21 ENCOUNTER — Ambulatory Visit: Payer: BC Managed Care – PPO | Admitting: Family Medicine

## 2021-08-21 ENCOUNTER — Encounter: Payer: Self-pay | Admitting: Family Medicine

## 2021-08-21 VITALS — BP 107/63 | HR 60 | Temp 98.1°F | Ht 70.0 in | Wt 175.8 lb

## 2021-08-21 DIAGNOSIS — E119 Type 2 diabetes mellitus without complications: Secondary | ICD-10-CM

## 2021-08-21 DIAGNOSIS — I10 Essential (primary) hypertension: Secondary | ICD-10-CM

## 2021-08-21 DIAGNOSIS — E782 Mixed hyperlipidemia: Secondary | ICD-10-CM | POA: Diagnosis not present

## 2021-08-21 LAB — POCT GLYCOSYLATED HEMOGLOBIN (HGB A1C): Hemoglobin A1C: 5.8 % — AB (ref 4.0–5.6)

## 2021-08-21 NOTE — Progress Notes (Signed)
Established patient visit   Patient: William Sexton   DOB: [birth year]   65 y.o. Male  MRN: [id number] Visit Date: 08/21/2021  Today's healthcare provider: Vernie Murders, PA-C   Chief Complaint  Patient presents with   Follow-up   Diabetes   Subjective  -------------------------------------------------------------------------------------------------------------------- HPI  Diabetes Mellitus Type II, Follow-up  Lab Results  Component Value Date   HGBA1C 6.6 (A) 05/01/2021   HGBA1C 6.4 (H) 10/29/2020   HGBA1C 6.1 (H) 10/10/2019   Wt Readings from Last 3 Encounters:  05/01/21 185 lb (83.9 kg)  01/02/21 187 lb (84.8 kg)  10/29/20 188 lb (85.3 kg)   Last seen for diabetes 3 months ago.  Management since then includes lifestyle changes. He reports good compliance with treatment. He is not having side effects. none Symptoms: No fatigue No foot ulcerations  No appetite changes No nausea  No paresthesia of the feet  No polydipsia  No polyuria Yes visual disturbances   No vomiting     Home blood sugar records:  none  Episodes of hypoglycemia? No    Current insulin regiment: none Most Recent Eye Exam: Appt in Feburary Current exercise: none Current diet habits: in general, a "healthy" diet    Pertinent Labs: Lab Results  Component Value Date   CHOL 168 10/29/2020   HDL 56 10/29/2020   LDLCALC 93 10/29/2020   TRIG 107 10/29/2020   CHOLHDL 2.8 10/10/2019   Lab Results  Component Value Date   NA 141 10/29/2020   K 5.5 (H) 10/29/2020   CREATININE 0.92 10/29/2020   GFRNONAA 88 10/29/2020   GFRAA 102 10/29/2020   GLUCOSE 100 (H) 10/29/2020     ---------------------------------------------------------------------------------------------------   Patient Active Problem List   Diagnosis Date Noted   Multinodular goiter 01/02/2021   Allergic rhinitis 10/06/2018   Radiculopathy of lumbar region 11/18/2017   Hx of excision of lamina of cervical vertebra for  decompression of spinal cord 06/23/2017   Prostate cancer ([place]) 06/17/2017   Cervical spinal stenosis 06/17/2017   BPH (benign prostatic hyperplasia) 12/31/2016   Diabetes mellitus type 2, controlled ([place]) 12/31/2016   Sleep apnea 11/03/2016   Depression    Hyperlipidemia    Hypertension    Enlarged prostate without lower urinary tract symptoms (luts) 05/17/2015   Past Medical History:  Diagnosis Date   Allergy    Anxiety    Arthritis    Cerebral concussion    Chronic back pain    Depression    Diabetes mellitus without complication ([place])    Pt takes Metformin.   Hyperlipidemia    Hypertension    Prostate cancer ([place])    Sleep apnea    Spinal stenosis    Thyroid goiter    Allergies  Allergen Reactions   Other Other (See Comments)    Environmental/seasonal      Medications: Outpatient Medications Prior to Visit  Medication Sig   acetaminophen (TYLENOL) 325 MG tablet Take 650 mg by mouth.   atorvastatin (LIPITOR) 40 MG tablet Take 1 tablet (40 mg total) by mouth daily.   celecoxib (CELEBREX) 100 MG capsule TAKE 1 TO 2 CAPSULES BY MOUTH DAILY   citalopram (CELEXA) 20 MG tablet TAKE 1 TABLET(20 MG) BY MOUTH DAILY   fluticasone (FLONASE) 50 MCG/ACT nasal spray    gabapentin (NEURONTIN) 300 MG capsule TAKE 1 CAPSULE(300 MG) BY MOUTH THREE TIMES DAILY   lisinopril (ZESTRIL) 20 MG tablet TAKE 1 TABLET(20 MG) BY MOUTH DAILY   Multiple Vitamin (  MULTIVITAMIN) capsule Take 1 capsule by mouth daily.   tamsulosin (FLOMAX) 0.4 MG CAPS capsule Take 1 capsule (0.4 mg total) by mouth daily.   No facility-administered medications prior to visit.    Review of Systems  Constitutional: Negative.   HENT: Negative.    Eyes: Negative.   Respiratory: Negative.    Cardiovascular: Negative.   Gastrointestinal: Negative.   Genitourinary: Negative.    Last CBC Lab Results  Component Value Date   WBC 4.4 10/29/2020   HGB 15.7 10/29/2020   HCT 46.7 10/29/2020   MCV 92 10/29/2020    MCH 30.8 10/29/2020   RDW 12.3 10/29/2020   PLT 199 [id number]   Last metabolic panel Lab Results  Component Value Date   GLUCOSE 100 (H) 10/29/2020   NA 141 10/29/2020   K 5.5 (H) 10/29/2020   CL 101 10/29/2020   CO2 27 10/29/2020   BUN 11 10/29/2020   CREATININE 0.92 10/29/2020   GFRNONAA 88 10/29/2020   GFRAA 102 10/29/2020   CALCIUM 9.8 10/29/2020   PROT 6.7 10/29/2020   ALBUMIN 4.6 10/29/2020   LABGLOB 2.1 10/29/2020   AGRATIO 2.2 10/29/2020   BILITOT 0.5 10/29/2020   ALKPHOS 71 10/29/2020   AST 23 10/29/2020   ALT 29 10/29/2020   Last lipids Lab Results  Component Value Date   CHOL 168 10/29/2020   HDL 56 10/29/2020   LDLCALC 93 10/29/2020   TRIG 107 10/29/2020   CHOLHDL 2.8 10/10/2019   Last hemoglobin A1c Lab Results  Component Value Date   HGBA1C 6.6 (A) 05/01/2021   Last thyroid functions Lab Results  Component Value Date   TSH 0.740 10/29/2020   T4TOTAL 6.5 10/29/2020        Objective  -------------------------------------------------------------------------------------------------------------------- BP 107/63 (BP Location: Right Arm, Patient Position: Sitting, Cuff Size: Normal)   Pulse 60   Temp 98.1 F (36.7 C) (Oral)   Ht '5\' 10"'$  (1.778 m)   Wt 175 lb 12.8 oz (79.7 kg)   SpO2 100%   BMI 25.22 kg/m  BP Readings from Last 3 Encounters:  08/21/21 107/63  05/01/21 127/62  01/02/21 120/76   Wt Readings from Last 3 Encounters:  08/21/21 175 lb 12.8 oz (79.7 kg)  05/01/21 185 lb (83.9 kg)  01/02/21 187 lb (84.8 kg)    Physical Exam Constitutional:      General: He is not in acute distress.    Appearance: He is well-developed.  HENT:     Head: Normocephalic and atraumatic.     Right Ear: Hearing normal.     Left Ear: Hearing normal.     Nose: Nose normal.  Eyes:     General: Lids are normal. No scleral icterus.       Right eye: No discharge.        Left eye: No discharge.     Conjunctiva/sclera: Conjunctivae normal.   Cardiovascular:     Rate and Rhythm: Normal rate.  Pulmonary:     Effort: Pulmonary effort is normal. No respiratory distress.  Musculoskeletal:        General: Normal range of motion.  Skin:    Findings: No lesion or rash.  Neurological:     Mental Status: He is alert and oriented to person, place, and time.  Psychiatric:        Speech: Speech normal.        Behavior: Behavior normal.        Thought Content: Thought content normal.  No results found for any visits on 08/21/21.  Assessment & Plan  ---------------------------------------------------------------------------------------------------------------------- 1. Controlled type 2 diabetes mellitus without complication, without long-term current use of insulin ([facility]) Hgb A1C 5.8 today. Getting eye exam on 09-03-21 and following a low fat diabetic diet. Recheck labs. - POCT HgB A1C - CBC with Differential/Platelet - Comprehensive metabolic panel - Lipid panel  2. Mixed hyperlipidemia Tolerating the Atorvastatin 40 mg qd. Continues low fat diet and will recheck labs. - Comprehensive metabolic panel - Lipid panel - TSH  3. Essential hypertension Continues Lisinopril without side effects. Has had cardiac work up and gets meds refilled through the [place]. Recheck labs. - CBC with Differential/Platelet - Comprehensive metabolic panel - Lipid panel - TSH   No follow-ups on file.      I, Annelyse Rey, PA-C, have reviewed all documentation for this visit. The documentation on 08/21/21 for the exam, diagnosis, procedures, and orders are all accurate and complete.    Vernie Murders, PA-C  [employer] [phone] (phone) [phone] (fax)  [place]

## 2021-08-22 LAB — CBC WITH DIFFERENTIAL/PLATELET
Basophils Absolute: 0 10*3/uL (ref 0.0–0.2)
Basos: 1 %
EOS (ABSOLUTE): 0 10*3/uL (ref 0.0–0.4)
Eos: 1 %
Hematocrit: 44 % (ref 37.5–51.0)
Hemoglobin: 15.1 g/dL (ref 13.0–17.7)
Immature Grans (Abs): 0 10*3/uL (ref 0.0–0.1)
Immature Granulocytes: 0 %
Lymphocytes Absolute: 1.2 10*3/uL (ref 0.7–3.1)
Lymphs: 34 %
MCH: 31.9 pg (ref 26.6–33.0)
MCHC: 34.3 g/dL (ref 31.5–35.7)
MCV: 93 fL (ref 79–97)
Monocytes Absolute: 0.5 10*3/uL (ref 0.1–0.9)
Monocytes: 16 %
Neutrophils Absolute: 1.6 10*3/uL (ref 1.4–7.0)
Neutrophils: 48 %
Platelets: 184 10*3/uL (ref 150–450)
RBC: 4.74 x10E6/uL (ref 4.14–5.80)
RDW: 12.6 % (ref 11.6–15.4)
WBC: 3.4 10*3/uL (ref 3.4–10.8)

## 2021-08-22 LAB — COMPREHENSIVE METABOLIC PANEL
ALT: 27 IU/L (ref 0–44)
AST: 17 IU/L (ref 0–40)
Albumin/Globulin Ratio: 2 (ref 1.2–2.2)
Albumin: 4.4 g/dL (ref 3.8–4.8)
Alkaline Phosphatase: 71 IU/L (ref 44–121)
BUN/Creatinine Ratio: 16 (ref 10–24)
BUN: 14 mg/dL (ref 8–27)
Bilirubin Total: 0.5 mg/dL (ref 0.0–1.2)
CO2: 24 mmol/L (ref 20–29)
Calcium: 9.7 mg/dL (ref 8.6–10.2)
Chloride: 98 mmol/L (ref 96–106)
Creatinine, Ser: 0.86 mg/dL (ref 0.76–1.27)
Globulin, Total: 2.2 g/dL (ref 1.5–4.5)
Glucose: 103 mg/dL — ABNORMAL HIGH (ref 65–99)
Potassium: 4.9 mmol/L (ref 3.5–5.2)
Sodium: 138 mmol/L (ref 134–144)
Total Protein: 6.6 g/dL (ref 6.0–8.5)
eGFR: 97 mL/min/{1.73_m2} (ref >59)

## 2021-08-22 LAB — LIPID PANEL
Chol/HDL Ratio: 2.5 ratio (ref 0.0–5.0)
Cholesterol, Total: 150 mg/dL (ref 100–199)
HDL: 60 mg/dL (ref >39)
LDL Chol Calc (NIH): 79 mg/dL (ref 0–99)
Triglycerides: 49 mg/dL (ref 0–149)
VLDL Cholesterol Cal: 11 mg/dL (ref 5–40)

## 2021-08-22 LAB — TSH: TSH: 0.771 u[IU]/mL (ref 0.450–4.500)

## 2021-09-10 DIAGNOSIS — E119 Type 2 diabetes mellitus without complications: Secondary | ICD-10-CM | POA: Diagnosis not present

## 2021-09-17 DIAGNOSIS — R42 Dizziness and giddiness: Secondary | ICD-10-CM | POA: Diagnosis not present

## 2021-09-17 DIAGNOSIS — I10 Essential (primary) hypertension: Secondary | ICD-10-CM | POA: Diagnosis not present

## 2021-09-17 DIAGNOSIS — R002 Palpitations: Secondary | ICD-10-CM | POA: Diagnosis not present

## 2021-09-17 DIAGNOSIS — H539 Unspecified visual disturbance: Secondary | ICD-10-CM | POA: Diagnosis not present

## 2021-10-09 DIAGNOSIS — F339 Major depressive disorder, recurrent, unspecified: Secondary | ICD-10-CM | POA: Diagnosis not present

## 2021-10-09 DIAGNOSIS — Z818 Family history of other mental and behavioral disorders: Secondary | ICD-10-CM | POA: Diagnosis not present

## 2021-11-03 ENCOUNTER — Other Ambulatory Visit: Payer: Self-pay | Admitting: Family Medicine

## 2021-11-03 DIAGNOSIS — F3342 Major depressive disorder, recurrent, in full remission: Secondary | ICD-10-CM

## 2021-11-03 NOTE — Telephone Encounter (Signed)
[place] faxed refill request for the following medications:   citalopram (CELEXA) 20 MG tablet   Please advise.

## 2021-11-04 ENCOUNTER — Ambulatory Visit: Payer: BC Managed Care – PPO | Admitting: Family Medicine

## 2021-11-04 ENCOUNTER — Encounter: Payer: Self-pay | Admitting: Family Medicine

## 2021-11-04 ENCOUNTER — Other Ambulatory Visit: Payer: Self-pay

## 2021-11-04 VITALS — BP 100/68 | HR 60 | Temp 97.6°F | Resp 16 | Wt 175.0 lb

## 2021-11-04 DIAGNOSIS — L03011 Cellulitis of right finger: Secondary | ICD-10-CM | POA: Insufficient documentation

## 2021-11-04 MED ORDER — DOXYCYCLINE HYCLATE 100 MG PO TABS: 100.0000 mg | ORAL_TABLET | Freq: Two times a day (BID) | ORAL | 0 refills | Status: AC

## 2021-11-04 MED ORDER — CITALOPRAM HYDROBROMIDE 20 MG PO TABS: ORAL_TABLET | ORAL | 6 refills | Status: AC

## 2021-11-04 NOTE — Addendum Note (Signed)
Addended by: Doristine Devoid on: 11/04/2021 09:50 AM   Modules accepted: Orders

## 2021-11-04 NOTE — Telephone Encounter (Signed)
LOV: 08/21/2021 LR: 10/29/2020 Future appointment scheduled: Today

## 2021-11-04 NOTE — Assessment & Plan Note (Signed)
Acute onset Start doxycycline 100mg  BID for 7 days

## 2021-11-04 NOTE — Progress Notes (Signed)
Established patient visit   Patient: William Sexton   DOB: [birth year]   65 y.o. Male  MRN: [id number] Visit Date: 11/04/2021  Today's healthcare provider: Lavon Paganini, MD   Chief Complaint  Patient presents with   Finger Injury   Subjective     Infection started Sunday morning Redness and swelling of nail bed on right middle finger He has a fingernail ridge that he bites History of similar infection in 1983 which required drainage of abscess L hand dominant  Medications: Outpatient Medications Prior to Visit  Medication Sig   acetaminophen (TYLENOL) 325 MG tablet Take 650 mg by mouth.   atorvastatin (LIPITOR) 40 MG tablet Take 1 tablet (40 mg total) by mouth daily.   celecoxib (CELEBREX) 100 MG capsule TAKE 1 TO 2 CAPSULES BY MOUTH DAILY (Patient taking differently: as needed. 1-2 capsules as needed)   citalopram (CELEXA) 20 MG tablet TAKE 1 TABLET(20 MG) BY MOUTH DAILY   fluticasone (FLONASE) 50 MCG/ACT nasal spray    gabapentin (NEURONTIN) 300 MG capsule TAKE 1 CAPSULE(300 MG) BY MOUTH THREE TIMES DAILY   lisinopril (ZESTRIL) 20 MG tablet TAKE 1 TABLET(20 MG) BY MOUTH DAILY   Multiple Vitamin (MULTIVITAMIN) capsule Take 1 capsule by mouth daily.   tamsulosin (FLOMAX) 0.4 MG CAPS capsule Take 1 capsule (0.4 mg total) by mouth daily.   No facility-administered medications prior to visit.    Review of Systems  Constitutional: Negative.  Negative for fever.  Skin:  Positive for wound.  All other systems reviewed and are negative.     Objective    BP 100/68 (BP Location: Left Arm, Patient Position: Sitting, Cuff Size: Large)   Pulse 60   Temp 97.6 F (36.4 C) (Temporal)   Resp 16   Wt 175 lb (79.4 kg)   SpO2 98%   BMI 25.11 kg/m  {Show previous vital signs (optional):23777}  Physical Exam Vitals reviewed.  Constitutional:      General: He is not in acute distress.    Appearance: Normal appearance. He is not ill-appearing or toxic-appearing.  HENT:      Head: Normocephalic and atraumatic.     Right Ear: External ear normal.     Left Ear: External ear normal.     Nose: Nose normal.     Mouth/Throat:     Mouth: Mucous membranes are moist.     Pharynx: Oropharynx is clear. No posterior oropharyngeal erythema.  Eyes:     General: No scleral icterus.    Extraocular Movements: Extraocular movements intact.     Conjunctiva/sclera: Conjunctivae normal.     Pupils: Pupils are equal, round, and reactive to light.  Cardiovascular:     Rate and Rhythm: Normal rate and regular rhythm.     Pulses: Normal pulses.     Heart sounds: Normal heart sounds. No murmur heard.   No friction rub. No gallop.  Pulmonary:     Effort: Pulmonary effort is normal. No respiratory distress.     Breath sounds: Normal breath sounds. No wheezing, rhonchi or rales.  Abdominal:     General: Abdomen is flat. There is no distension.     Palpations: Abdomen is soft.     Tenderness: There is no abdominal tenderness.  Musculoskeletal:        General: Normal range of motion.     Cervical back: Normal range of motion and neck supple.  Skin:    General: Skin is warm and dry.     Capillary  Refill: Capillary refill takes less than 2 seconds.     Comments: Erythema of proximal nail bed of R middle finger  Neurological:     General: No focal deficit present.     Mental Status: He is alert and oriented to person, place, and time.  Psychiatric:        Mood and Affect: Mood normal.        Behavior: Behavior normal.      No results found for any visits on 11/04/21.  Assessment & Plan     Problem List Items Addressed This Visit       Musculoskeletal and Integument   Paronychia of right middle finger - Primary    Acute onset Start doxycycline 100mg  BID for 7 days      Discussed return precautions Warm compresses or soaks   Return in about 6 months (around 05/04/2022) for CPE, With new PCP.      Nelva Nay, Medical Student 11/04/2021, 11:15 AM     Patient seen along with MS3 student Nelva Nay. I personally evaluated this patient along with the student, and verified all aspects of the history, physical exam, and medical decision making as documented by the student. I agree with the student's documentation and have made all necessary edits.  Amirah Goerke, Dionne Bucy, MD, MPH [facility]

## 2022-01-08 ENCOUNTER — Ambulatory Visit: Payer: No Typology Code available for payment source | Admitting: Endocrinology

## 2022-02-06 DIAGNOSIS — C61 Malignant neoplasm of prostate: Secondary | ICD-10-CM | POA: Diagnosis not present

## 2022-02-06 DIAGNOSIS — D400 Neoplasm of uncertain behavior of prostate: Secondary | ICD-10-CM | POA: Diagnosis not present

## 2022-02-06 DIAGNOSIS — N310 Uninhibited neuropathic bladder, not elsewhere classified: Secondary | ICD-10-CM | POA: Diagnosis not present

## 2022-02-06 DIAGNOSIS — R3914 Feeling of incomplete bladder emptying: Secondary | ICD-10-CM | POA: Diagnosis not present

## 2022-02-06 DIAGNOSIS — R351 Nocturia: Secondary | ICD-10-CM | POA: Diagnosis not present

## 2022-03-10 DIAGNOSIS — E119 Type 2 diabetes mellitus without complications: Secondary | ICD-10-CM | POA: Diagnosis not present

## 2022-03-10 DIAGNOSIS — H47093 Other disorders of optic nerve, not elsewhere classified, bilateral: Secondary | ICD-10-CM | POA: Diagnosis not present

## 2022-03-10 DIAGNOSIS — H25813 Combined forms of age-related cataract, bilateral: Secondary | ICD-10-CM | POA: Diagnosis not present

## 2022-03-10 DIAGNOSIS — H524 Presbyopia: Secondary | ICD-10-CM | POA: Diagnosis not present

## 2022-03-23 DIAGNOSIS — C61 Malignant neoplasm of prostate: Secondary | ICD-10-CM | POA: Diagnosis not present

## 2022-03-23 DIAGNOSIS — E119 Type 2 diabetes mellitus without complications: Secondary | ICD-10-CM | POA: Diagnosis not present

## 2022-03-23 DIAGNOSIS — E042 Nontoxic multinodular goiter: Secondary | ICD-10-CM | POA: Diagnosis not present

## 2022-03-23 DIAGNOSIS — E785 Hyperlipidemia, unspecified: Secondary | ICD-10-CM | POA: Diagnosis not present

## 2022-03-23 DIAGNOSIS — I10 Essential (primary) hypertension: Secondary | ICD-10-CM | POA: Diagnosis not present

## 2022-04-01 DIAGNOSIS — E119 Type 2 diabetes mellitus without complications: Secondary | ICD-10-CM | POA: Diagnosis not present

## 2022-04-01 DIAGNOSIS — I10 Essential (primary) hypertension: Secondary | ICD-10-CM | POA: Diagnosis not present

## 2022-04-01 DIAGNOSIS — F32A Depression, unspecified: Secondary | ICD-10-CM | POA: Diagnosis not present

## 2022-04-01 DIAGNOSIS — F1010 Alcohol abuse, uncomplicated: Secondary | ICD-10-CM | POA: Diagnosis not present

## 2022-04-01 DIAGNOSIS — Z23 Encounter for immunization: Secondary | ICD-10-CM | POA: Diagnosis not present

## 2022-04-09 DIAGNOSIS — F339 Major depressive disorder, recurrent, unspecified: Secondary | ICD-10-CM | POA: Diagnosis not present

## 2022-04-09 DIAGNOSIS — F1090 Alcohol use, unspecified, uncomplicated: Secondary | ICD-10-CM | POA: Diagnosis not present

## 2022-04-14 DIAGNOSIS — Z122 Encounter for screening for malignant neoplasm of respiratory organs: Secondary | ICD-10-CM | POA: Diagnosis not present

## 2022-04-14 DIAGNOSIS — R918 Other nonspecific abnormal finding of lung field: Secondary | ICD-10-CM | POA: Diagnosis not present

## 2022-04-14 DIAGNOSIS — I272 Pulmonary hypertension, unspecified: Secondary | ICD-10-CM | POA: Diagnosis not present

## 2022-04-14 DIAGNOSIS — F17211 Nicotine dependence, cigarettes, in remission: Secondary | ICD-10-CM | POA: Diagnosis not present

## 2022-04-17 DIAGNOSIS — R918 Other nonspecific abnormal finding of lung field: Secondary | ICD-10-CM | POA: Diagnosis not present

## 2022-04-17 DIAGNOSIS — Z122 Encounter for screening for malignant neoplasm of respiratory organs: Secondary | ICD-10-CM | POA: Diagnosis not present

## 2022-04-17 DIAGNOSIS — Z87891 Personal history of nicotine dependence: Secondary | ICD-10-CM | POA: Diagnosis not present

## 2022-04-21 DIAGNOSIS — R4702 Dysphasia: Secondary | ICD-10-CM | POA: Diagnosis not present

## 2022-04-21 DIAGNOSIS — K209 Esophagitis, unspecified without bleeding: Secondary | ICD-10-CM | POA: Diagnosis not present

## 2022-04-21 DIAGNOSIS — K219 Gastro-esophageal reflux disease without esophagitis: Secondary | ICD-10-CM | POA: Diagnosis not present

## 2022-04-21 DIAGNOSIS — R918 Other nonspecific abnormal finding of lung field: Secondary | ICD-10-CM | POA: Diagnosis not present

## 2022-07-02 DIAGNOSIS — I361 Nonrheumatic tricuspid (valve) insufficiency: Secondary | ICD-10-CM | POA: Diagnosis not present

## 2022-07-02 DIAGNOSIS — I340 Nonrheumatic mitral (valve) insufficiency: Secondary | ICD-10-CM | POA: Diagnosis not present

## 2022-07-28 DIAGNOSIS — E042 Nontoxic multinodular goiter: Secondary | ICD-10-CM | POA: Diagnosis not present

## 2022-08-18 DIAGNOSIS — N319 Neuromuscular dysfunction of bladder, unspecified: Secondary | ICD-10-CM | POA: Diagnosis not present

## 2022-08-18 DIAGNOSIS — D400 Neoplasm of uncertain behavior of prostate: Secondary | ICD-10-CM | POA: Diagnosis not present

## 2022-08-18 DIAGNOSIS — C61 Malignant neoplasm of prostate: Secondary | ICD-10-CM | POA: Diagnosis not present

## 2022-08-31 DIAGNOSIS — K2100 Gastro-esophageal reflux disease with esophagitis, without bleeding: Secondary | ICD-10-CM | POA: Insufficient documentation

## 2022-08-31 DIAGNOSIS — E042 Nontoxic multinodular goiter: Secondary | ICD-10-CM | POA: Diagnosis not present

## 2022-08-31 DIAGNOSIS — R000 Tachycardia, unspecified: Secondary | ICD-10-CM | POA: Insufficient documentation

## 2022-08-31 DIAGNOSIS — I10 Essential (primary) hypertension: Secondary | ICD-10-CM | POA: Diagnosis not present

## 2022-08-31 DIAGNOSIS — I700 Atherosclerosis of aorta: Secondary | ICD-10-CM | POA: Insufficient documentation

## 2022-08-31 DIAGNOSIS — E119 Type 2 diabetes mellitus without complications: Secondary | ICD-10-CM | POA: Diagnosis not present

## 2022-08-31 DIAGNOSIS — G4730 Sleep apnea, unspecified: Secondary | ICD-10-CM | POA: Diagnosis not present

## 2022-08-31 DIAGNOSIS — R040 Epistaxis: Secondary | ICD-10-CM | POA: Insufficient documentation

## 2022-09-09 DIAGNOSIS — I10 Essential (primary) hypertension: Secondary | ICD-10-CM | POA: Diagnosis not present

## 2022-09-09 DIAGNOSIS — E119 Type 2 diabetes mellitus without complications: Secondary | ICD-10-CM | POA: Diagnosis not present

## 2022-09-09 DIAGNOSIS — C61 Malignant neoplasm of prostate: Secondary | ICD-10-CM | POA: Diagnosis not present

## 2022-09-09 DIAGNOSIS — E7800 Pure hypercholesterolemia, unspecified: Secondary | ICD-10-CM | POA: Diagnosis not present

## 2022-09-25 DIAGNOSIS — E119 Type 2 diabetes mellitus without complications: Secondary | ICD-10-CM | POA: Diagnosis not present

## 2022-09-25 DIAGNOSIS — E875 Hyperkalemia: Secondary | ICD-10-CM | POA: Diagnosis not present

## 2022-11-05 ENCOUNTER — Ambulatory Visit
Admission: RE | Admit: 2022-11-05 | Discharge: 2022-11-05 | Disposition: A | Discharge status: Home / Self Care | Payer: BC Managed Care – PPO | Source: Ambulatory Visit

## 2022-11-05 VITALS — BP 160/80 | HR 66 | Temp 98.2°F | Resp 18

## 2022-11-05 DIAGNOSIS — M62830 Muscle spasm of back: Secondary | ICD-10-CM

## 2022-11-05 DIAGNOSIS — R918 Other nonspecific abnormal finding of lung field: Secondary | ICD-10-CM | POA: Insufficient documentation

## 2022-11-05 DIAGNOSIS — F339 Major depressive disorder, recurrent, unspecified: Secondary | ICD-10-CM | POA: Insufficient documentation

## 2022-11-05 DIAGNOSIS — F1010 Alcohol abuse, uncomplicated: Secondary | ICD-10-CM | POA: Insufficient documentation

## 2022-11-05 DIAGNOSIS — Z122 Encounter for screening for malignant neoplasm of respiratory organs: Secondary | ICD-10-CM | POA: Insufficient documentation

## 2022-11-05 DIAGNOSIS — K219 Gastro-esophageal reflux disease without esophagitis: Secondary | ICD-10-CM | POA: Insufficient documentation

## 2022-11-05 DIAGNOSIS — F17201 Nicotine dependence, unspecified, in remission: Secondary | ICD-10-CM | POA: Insufficient documentation

## 2022-11-05 DIAGNOSIS — I340 Nonrheumatic mitral (valve) insufficiency: Secondary | ICD-10-CM | POA: Insufficient documentation

## 2022-11-05 DIAGNOSIS — Z789 Other specified health status: Secondary | ICD-10-CM | POA: Insufficient documentation

## 2022-11-05 DIAGNOSIS — I471 Supraventricular tachycardia, unspecified: Secondary | ICD-10-CM | POA: Insufficient documentation

## 2022-11-05 MED ORDER — CELECOXIB 50 MG PO CAPS: 50.0000 mg | ORAL_CAPSULE | Freq: Two times a day (BID) | ORAL | 0 refills | Status: AC

## 2022-11-05 MED ORDER — CYCLOBENZAPRINE HCL 10 MG PO TABS: 10.0000 mg | ORAL_TABLET | Freq: Two times a day (BID) | ORAL | 0 refills | Status: AC | PRN

## 2022-11-05 NOTE — Discharge Instructions (Addendum)
Recommend follow-up with orthopedic provider if your symptoms do not resolve in the next 2 weeks.

## 2022-11-05 NOTE — ED Triage Notes (Signed)
Pt. Presents to UC c/o back pain after tripping over furniture and hitting his back against the edge of the bed 5 days ago. Pt.endorses a dull tightness in his mid back area with movement. Pt. Has states he has an extensive back surgery hx.

## 2022-11-05 NOTE — ED Provider Notes (Signed)
William Sexton    CSN: [id number] Arrival date & time: 11/05/22  1514      History   Chief Complaint Chief Complaint  Patient presents with   Fall    I tripped at home and fell into some furniture. It occurred on Saturday 10-31-22. I am having pain in my mid to lower back. - Entered by patient    HPI William Sexton is a 66 y.o. male.    Fall    Presents to urgent care with complaint of back pain after tripping over furniture 5 days ago.  Patient endorses "dull tightness" in mid thoracic back with movement.  He states he had extensive back surgery in the past.  Endorses surgery for cervical spinal stenosis as well as lumbar.  Also endorses fusion of his L1 and T12.  Past Medical History:  Diagnosis Date   Allergy    Anxiety    Arthritis    Cerebral concussion    Chronic back pain    Depression    Diabetes mellitus without complication ([place])    Pt takes Metformin.   Hyperlipidemia    Hypertension    Prostate cancer ([place])    Sleep apnea    Spinal stenosis    Thyroid goiter     Patient Active Problem List   Diagnosis Date Noted   Paronychia of right middle finger 11/04/2021   Multinodular goiter 01/02/2021   Allergic rhinitis 10/06/2018   Radiculopathy of lumbar region 11/18/2017   Hx of excision of lamina of cervical vertebra for decompression of spinal cord 06/23/2017   Prostate cancer ([place]) 06/17/2017   Cervical spinal stenosis 06/17/2017   BPH (benign prostatic hyperplasia) 12/31/2016   Diabetes mellitus type 2, controlled ([place]) 12/31/2016   Sleep apnea 11/03/2016   Depression    Hyperlipidemia    Hypertension    Enlarged prostate without lower urinary tract symptoms (luts) 05/17/2015    Past Surgical History:  Procedure Laterality Date   CERVICAL DISCECTOMY  05/26/2017   COLONOSCOPY WITH PROPOFOL N/A 01/02/2019   Procedure: COLONOSCOPY WITH PROPOFOL;  Surgeon: Jonathon Bellows, MD;  Location: [facility] ENDOSCOPY;  Service: Gastroenterology;   Laterality: N/A;   FRACTURE SURGERY     L1-2 Fusion     PROSTATE SURGERY     SPINE SURGERY         Home Medications    Prior to Admission medications   Medication Sig Start Date End Date Taking? Authorizing Provider  acetaminophen (TYLENOL) 325 MG tablet Take 650 mg by mouth.    [provider]  atorvastatin (LIPITOR) 40 MG tablet Take 1 tablet (40 mg total) by mouth daily. 10/29/20   Chrismon, Vickki Muff, PA-C  celecoxib (CELEBREX) 100 MG capsule TAKE 1 TO 2 CAPSULES BY MOUTH DAILY Patient taking differently: as needed. 1-2 capsules as needed 06/01/21   Chrismon, Vickki Muff, PA-C  citalopram (CELEXA) 20 MG tablet TAKE 1 TABLET(20 MG) BY MOUTH DAILY 11/04/21   Virginia Crews, MD  fluticasone Asencion Islam) 50 MCG/ACT nasal spray  10/23/15   [provider]  gabapentin (NEURONTIN) 300 MG capsule TAKE 1 CAPSULE(300 MG) BY MOUTH THREE TIMES DAILY 10/29/20   Chrismon, Vickki Muff, PA-C  lisinopril (ZESTRIL) 20 MG tablet TAKE 1 TABLET(20 MG) BY MOUTH DAILY 04/19/21   Chrismon, Vickki Muff, PA-C  Multiple Vitamin (MULTIVITAMIN) capsule Take 1 capsule by mouth daily.    [provider]  tamsulosin (FLOMAX) 0.4 MG CAPS capsule Take 1 capsule (0.4 mg total) by mouth daily. 08/02/17  Guadalupe Maple, MD    Family History Family History  Problem Relation Age of Onset   Hyperlipidemia Father    Hypertension Father    Heart disease Father 28   Macular degeneration Mother    Breast cancer Sister 63   Hyperthyroidism Sister    Diabetes Maternal Grandmother    Macular degeneration Maternal Grandmother    Heart disease Maternal Grandfather    Heart disease Paternal Grandfather 100   COPD Neg Hx    Stroke Neg Hx     Social History Social History   Tobacco Use   Smoking status: Former    Packs/day: 1.00    Years: 40.00    Total pack years: 40.00    Types: Cigarettes    Quit date: 08/08/2011    Years since quitting: 11.2   Smokeless tobacco: Never  Vaping Use   Vaping  Use: Never used  Substance Use Topics   Alcohol use: No    Alcohol/week: 0.0 standard drinks of alcohol   Drug use: No     Allergies   Other   Review of Systems Review of Systems   Physical Exam Triage Vital Signs ED Triage Vitals  Enc Vitals Group     BP      Pulse      Resp      Temp      Temp src      SpO2      Weight      Height      Head Circumference      Peak Flow      Pain Score      Pain Loc      Pain Edu?      Excl. in [place]?    No data found.  Updated Vital Signs There were no vitals taken for this visit.  Visual Acuity Right Eye Distance:   Left Eye Distance:   Bilateral Distance:    Right Eye Near:   Left Eye Near:    Bilateral Near:     Physical Exam Vitals reviewed.  Musculoskeletal:     Thoracic back: Spasms present. No deformity, tenderness or bony tenderness. Normal range of motion.     Lumbar back: Spasms present. No tenderness or bony tenderness.     Comments: No tenderness with palpation.  Patient experienced severe muscle spasm when he attempted to lay supine on the exam table.  Exam was cut short because of pain.  Skin:    General: Skin is warm and dry.  Neurological:     General: No focal deficit present.     Mental Status: He is alert and oriented to person, place, and time.  Psychiatric:        Mood and Affect: Mood normal.        Behavior: Behavior normal.      UC Treatments / Results  Labs (all labs ordered are listed, but only abnormal results are displayed) Labs Reviewed - No data to display  EKG   Radiology No results found.  Procedures Procedures (including critical care time)  Medications Ordered in UC Medications - No data to display  Initial Impression / Assessment and Plan / UC Course  I have reviewed the triage vital signs and the nursing notes.  Pertinent labs & imaging results that were available during my care of the patient were reviewed by me and considered in my medical decision making (see  chart for details).   Suspect pain due to muscle spasms  secondary to muscle strain in his lumbar and thoracic back.  Will prescribe Flexeril.  Patient also endorses past use of Celebrex and requests prescription.   Final Clinical Impressions(s) / UC Diagnoses   Final diagnoses:  None   Discharge Instructions   None    ED Prescriptions   None    PDMP not reviewed this encounter.   Rose Phi, [place] 11/05/22 1542

## 2023-06-06 ENCOUNTER — Ambulatory Visit: Payer: BC Managed Care – PPO

## 2024-08-15 ENCOUNTER — Other Ambulatory Visit: Payer: Self-pay | Admitting: Physician Assistant

## 2024-08-15 DIAGNOSIS — F17211 Nicotine dependence, cigarettes, in remission: Secondary | ICD-10-CM

## 2024-11-20 ENCOUNTER — Telehealth: Payer: Self-pay

## 2024-11-20 NOTE — Telephone Encounter (Signed)
 Tomika from the [facility] called to inquire if the patient has an appointment scheduled with our office. I informed her that the patient does not have an appointment and that the last referral was sent over five years ago. She requested the fax number, which I provided [phone]). She stated that she will send the referral.

## 2025-01-03 ENCOUNTER — Ambulatory Visit: Payer: Self-pay | Admitting: General Surgery

## 2025-01-12 ENCOUNTER — Other Ambulatory Visit: Payer: Self-pay

## 2025-01-15 NOTE — Progress Notes (Unsigned)
 "   01/17/2025 William Sexton [id number] 04/19/1956  Gastroenterology Office Note    Referring Provider: Silva Bernardino DELENA DEVONNA Primary Care Physician:  Silva Bernardino DELENA, PA-C  Primary GI Provider: Celestia Rima, NP; Jinny Carmine, MD    Chief Complaint   Chief Complaint  Patient presents with   New Patient (Initial Visit)    Dysphagia- reflux- nausea in the morning-sched thyroidectomy 01-23-25     History of Present Illness   William Sexton is a 69 y.o. male presenting today at the request of Silva Bernardino A, PA-C due to dysphagia.  Discussed the use of AI scribe software for clinical note transcription with the patient, who gave verbal consent to proceed.  The patient reports a large thyroid  goiter that makes it difficult to breathe when lying flat and prevents him from sleeping supine. He is scheduled for total thyroidectomy next week. Significant discomfort occurs when lying back, requiring him to sleep on his side. Family history is notable for a sister with similar esophageal problems.  Intermittent dysphagia is present, not consistently associated with food, pills, or liquids, but often occurs with saliva. Swallowing is sometimes hard to initiate, particularly when not eating or drinking, and these episodes are stressful, especially at night. Symptoms are not present daily and onset is unclear. He previously underwent a swallow evaluation a couple of years ago, but does not recall the results.   Gastroesophageal reflux symptoms are treated with pantoprazole 40 mg twice daily since November 2025. Occasional indigestion and sensation of food or acid rising in the throat occur, particularly at night. Nocturnal reflux does not wake him from sleep. Caffeine, alcohol, NSAIDs, and fatty foods are avoided, but he drinks one to two sodas and four to five cups of decaffeinated coffee daily, along with water and milk. Nausea is managed with as-needed medication, primarily in the mornings, with  good effect. Denies abdominal pain.    Chronic constipation has improved with regular use of Senokot and Miralax, resulting in daily bowel movements. Missed doses occasionally change stool consistency. Severe constipation was previously present for one to two weeks prior to starting these medications. Diet includes salads and avoids spicy, greasy, and fatty foods. No current abdominal pain or vomiting.  He is an ex-smoker, does not drink alcohol or use recreational drugs, and uses only Tylenol for pain. Low-dose aspirin was discontinued in preparation for surgery. Last colonoscopy in 2020 was unremarkable.    01/02/2019 Colonoscopy - The entire examined colon is normal on direct and retroflexion views.  - No specimens collected. - repeat in 10 years for screening  Past Medical History:  Diagnosis Date   Allergy    Anxiety    Arthritis    Cerebral concussion    Chronic back pain    Depression    Diabetes mellitus without complication ([facility])    Pt takes Metformin .   Hyperlipidemia    Hypertension    Prostate cancer ([facility])    Sleep apnea    Spinal stenosis    Thyroid  goiter     Past Surgical History:  Procedure Laterality Date   CERVICAL DISCECTOMY  05/26/2017   COLONOSCOPY WITH PROPOFOL  N/A 01/02/2019   Procedure: COLONOSCOPY WITH PROPOFOL ;  Surgeon: Therisa Bi, MD;  Location: [facility] ENDOSCOPY;  Service: Gastroenterology;  Laterality: N/A;   FRACTURE SURGERY     L1-2 Fusion     PROSTATE SURGERY     SPINE SURGERY      Current Outpatient Medications  Medication Sig Dispense Refill  acetaminophen (TYLENOL) 650 MG CR tablet Take 650 mg by mouth every 8 (eight) hours as needed for pain.     atorvastatin  (LIPITOR) 40 MG tablet Take 1 tablet (40 mg total) by mouth daily. 30 tablet 12   citalopram  (CELEXA ) 20 MG tablet TAKE 1 TABLET(20 MG) BY MOUTH DAILY 30 tablet 6   citalopram  (CELEXA ) 40 MG tablet Take 40 mg by mouth daily.     empagliflozin (JARDIANCE) 25 MG TABS tablet Take 25  mg by mouth daily.     ezetimibe (ZETIA) 10 MG tablet Take 10 mg by mouth daily.     gabapentin  (NEURONTIN ) 300 MG capsule TAKE 1 CAPSULE(300 MG) BY MOUTH THREE TIMES DAILY 90 capsule 6   hydrOXYzine (ATARAX) 25 MG tablet Take 25 mg by mouth every 6 (six) hours as needed for anxiety.     lisinopril  (ZESTRIL ) 20 MG tablet TAKE 1 TABLET(20 MG) BY MOUTH DAILY 30 tablet 0   Multiple Vitamin (MULTIVITAMIN) capsule Take 1 capsule by mouth daily.     ondansetron (ZOFRAN) 4 MG tablet Take 4 mg by mouth every 8 (eight) hours as needed for nausea or vomiting.     pantoprazole (PROTONIX) 20 MG tablet Take 20 mg by mouth daily. (Patient taking differently: Take 20 mg by mouth 2 (two) times daily.)     polyethylene glycol (MIRALAX / GLYCOLAX) 17 g packet Take 17 g by mouth daily.     senna-docusate (SENOKOT-S) 8.6-50 MG tablet Take 2 tablets by mouth daily.     sucralfate (CARAFATE) 1 g tablet Take 1 g by mouth daily.     tamsulosin  (FLOMAX ) 0.4 MG CAPS capsule Take 1 capsule (0.4 mg total) by mouth daily. 90 capsule 1   No current facility-administered medications for this visit.    Allergies as of 01/17/2025   (No Known Allergies)    Family History  Problem Relation Age of Onset   Hyperlipidemia Father    Hypertension Father    Heart disease Father 61   Macular degeneration Mother    Breast cancer Sister 33   Hyperthyroidism Sister    Diabetes Maternal Grandmother    Macular degeneration Maternal Grandmother    Heart disease Maternal Grandfather    Heart disease Paternal Grandfather 53   COPD Neg Hx    Stroke Neg Hx     Social History   Socioeconomic History   Marital status: Single    Spouse name: Not on file   Number of children: Not on file   Years of education: Not on file   Highest education level: Not on file  Occupational History   Not on file  Tobacco Use   Smoking status: Former    Current packs/day: 0.00    Average packs/day: 1 pack/day for 40.0 years (40.0 ttl pk-yrs)     Types: Cigarettes    Start date: 08/08/1971    Quit date: 08/08/2011    Years since quitting: 13.4   Smokeless tobacco: Never  Vaping Use   Vaping status: Never Used  Substance and Sexual Activity   Alcohol use: No    Alcohol/week: 0.0 standard drinks of alcohol   Drug use: No   Sexual activity: Not on file  Other Topics Concern   Not on file  Social History Narrative   Not on file   Social Drivers of Health   Tobacco Use: Medium Risk (01/17/2025)   Patient History    Smoking Tobacco Use: Former    Smokeless Tobacco Use: Never  Passive Exposure: Not on file  Financial Resource Strain: Low Risk  (05/24/2024)   Received from [facility]   Overall Financial Resource Strain (CARDIA)    Difficulty of Paying Living Expenses: Not hard at all  Food Insecurity: No Food Insecurity (05/24/2024)   Received from [facility]   Epic    Within the past 12 months, you worried that your food would run out before you got the money to buy more.: Never true    Within the past 12 months, the food you bought just didn't last and you didn't have money to get more.: Never true  Transportation Needs: No Transportation Needs (05/24/2024)   Received from [facility] - Transportation    In the past 12 months, has lack of transportation kept you from medical appointments or from getting medications?: No    Lack of Transportation (Non-Medical): No  Physical Activity: Not on file  Stress: Not on file  Social Connections: Unknown (04/29/2022)   Received from [facility]   Social Network    Social Network: Not on file  Intimate Partner Violence: Unknown (03/27/2022)   Received from [facility]   HITS    Physically Hurt: Not on file    Insult or Talk Down To: Not on file    Threaten Physical Harm: Not on file    Scream or Curse: Not on file  Depression (PHQ2-9): Not on file  Alcohol Screen: Not on file  Housing: High Risk (05/24/2024)    Received from [facility]   Epic    In the last 12 months, was there a time when you were not able to pay the mortgage or rent on time?: Yes    Number of Times Moved in the Last Year: Not on file    At any time in the past 12 months, were you homeless or living in a shelter (including now)?: No  Utilities: Not At Risk (05/24/2024)   Received from [facility] Utilities    Threatened with loss of utilities: No  Health Literacy: Not on file     RELEVANT GI HISTORY, IMAGING AND LABS: CBC    Component Value Date/Time   WBC 3.4 08/21/2021 0858   RBC 4.74 08/21/2021 0858   HGB 15.1 08/21/2021 0858   HCT 44.0 08/21/2021 0858   PLT 184 08/21/2021 0858   MCV 93 08/21/2021 0858   MCH 31.9 08/21/2021 0858   MCHC 34.3 08/21/2021 0858   RDW 12.6 08/21/2021 0858   LYMPHSABS 1.2 08/21/2021 0858   EOSABS 0.0 08/21/2021 0858   BASOSABS 0.0 08/21/2021 0858   No results for input(s): HGB in the last 8760 hours.  CMP     Component Value Date/Time   NA 138 08/21/2021 0858   K 4.9 08/21/2021 0858   CL 98 08/21/2021 0858   CO2 24 08/21/2021 0858   GLUCOSE 103 (H) 08/21/2021 0858   BUN 14 08/21/2021 0858   CREATININE 0.86 08/21/2021 0858   CALCIUM  9.7 08/21/2021 0858   PROT 6.6 08/21/2021 0858   ALBUMIN 4.4 08/21/2021 0858   AST 17 08/21/2021 0858   AST 29 08/08/2015 1036   ALT 27 08/21/2021 0858   ALT 32 08/08/2015 1036   ALKPHOS 71 08/21/2021 0858   BILITOT 0.5 08/21/2021 0858   GFRNONAA 88 10/29/2020 0851   GFRAA 102 10/29/2020 0851      Latest Ref Rng & Units 08/21/2021  8:58 AM 10/29/2020    8:51 AM 10/10/2019   10:24 AM  Hepatic Function  Total Protein 6.0 - 8.5 g/dL 6.6  6.7  7.0   Albumin 3.8 - 4.8 g/dL 4.4  4.6  4.5   AST 0 - 40 IU/L 17  23  28    ALT 0 - 44 IU/L 27  29  35   Alk Phosphatase 44 - 121 IU/L 71  71  79   Total Bilirubin 0.0 - 1.2 mg/dL 0.5  0.5  0.6       Review of Systems   All systems reviewed and negative  except where noted in HPI.    Physical Exam  BP 134/68   Pulse 84   Temp 98.4 F (36.9 C)   Ht 5' 9 (1.753 m)   Wt 171 lb 12.8 oz (77.9 kg)   SpO2 98%   BMI 25.37 kg/m  No LMP for male patient. General:   Alert and oriented. Pleasant and cooperative. Well-nourished and well-developed. In no acute distress.  Head:  Normocephalic and atraumatic. Eyes:  Without icterus Ears:  Normal auditory acuity. Neck:  Supple; Large goiter noted.  Lungs:  Respirations even and unlabored.  Clear throughout to auscultation.   No wheezes, crackles, or rhonchi. No acute distress. Heart:  Regular rate and rhythm; no murmurs, clicks, rubs, or gallops. Abdomen:  Normal bowel sounds.  No bruits.  Soft, non-tender and non-distended without masses, hepatosplenomegaly or hernias noted.  No guarding or rebound tenderness.  Rectal:  Deferred. Msk:  Symmetrical without gross deformities. Normal posture. Extremities:  Without edema. Neurologic:  Alert and  oriented x4;  grossly normal neurologically. Skin:  Intact without significant lesions or rashes. Psych:  Alert and cooperative. Normal mood and affect.   Assessment & Plan   William Sexton is a 69 y.o. male presenting today with dysphagia, GERD, and constipation.   Dysphagia - will plan for EGD once patient has recovered from thyroidectomy scheduled 01/23/2025 and cleared for procedure. I discussed risks of EGD with patient today, including risk of sedation, bleeding or perforation. Patient provides understanding and gave verbal consent to proceed. - In the interim patient advised about swallowing precautions.  Eat slowly, chew food well before swallowing. Drink liquids in between each bite to avoid food impaction.   GERD - Lifestyle changes discussed: avoid trigger foods, NSAIDS, ETOH; do not eat meals 3 hr prior to bed. - Continue PPI 30 min prior to meals - Continue pantoprazole 40 mg twice daily.  Chronic constipation. Has improved with Miralax,  Senokot, and high-fiber diet. Continue current regimen.   Follow up in 3 months  Grayce Bohr, DNP, AGNP-C [facility] Gastroenterology  "

## 2025-01-17 ENCOUNTER — Ambulatory Visit (INDEPENDENT_AMBULATORY_CARE_PROVIDER_SITE_OTHER): Payer: Self-pay | Admitting: Family Medicine

## 2025-01-17 ENCOUNTER — Encounter: Payer: Self-pay | Admitting: Family Medicine

## 2025-01-17 VITALS — BP 134/68 | HR 84 | Temp 98.4°F | Ht 69.0 in | Wt 171.8 lb

## 2025-01-17 DIAGNOSIS — R1310 Dysphagia, unspecified: Secondary | ICD-10-CM

## 2025-01-17 DIAGNOSIS — K219 Gastro-esophageal reflux disease without esophagitis: Secondary | ICD-10-CM | POA: Diagnosis not present

## 2025-01-17 DIAGNOSIS — K5909 Other constipation: Secondary | ICD-10-CM

## 2025-01-17 DIAGNOSIS — R1312 Dysphagia, oropharyngeal phase: Secondary | ICD-10-CM

## 2025-01-18 NOTE — Progress Notes (Signed)
 Surgical Instructions   Your procedure is scheduled on Febuary 3, 2026. Report to [facility] Main Entrance A at 5:30 A.M., then check in with the Admitting office. Any questions or running late day of surgery: call [phone]  Questions prior to your surgery date: call [phone], Monday-Friday, 8am-4pm. If you experience any cold or flu symptoms such as cough, fever, chills, shortness of breath, etc. between now and your scheduled surgery, please notify us  at the above number.     Remember:  Do not eat after midnight the night before your surgery   You may drink clear liquids until 4:30 the morning of your surgery.   Clear liquids allowed are: Water, Non-Citrus Juices (without pulp), Carbonated Beverages, Clear Tea (no milk, honey, etc.), Black Coffee Only (NO MILK, CREAM OR POWDERED CREAMER of any kind), and Gatorade.    Take these medicines the morning of surgery with A SIP OF WATER  citalopram  (CELEXA )  atorvastatin  (LIPITOR) ezetimibe (ZETIA)  gabapentin  (NEURONTIN )  pantoprazole (PROTONIX)  sucralfate (CARAFATE)  tamsulosin  (FLOMAX )   May take these medicines IF NEEDED: acetaminophen (TYLENOL)   ondansetron (ZOFRAN)  WHAT DO I DO ABOUT MY DIABETES MEDICATION?   Do not take oral diabetes medicinesempagliflozin (JARDIANCE)  (pills) the morning of surgery.  empagliflozin (JARDIANCE) LAST DOSE:01-19-25  The day of surgery, do not take other diabetes injectables, including Byetta (exenatide), Bydureon (exenatide ER), Victoza (liraglutide), or Trulicity (dulaglutide).  If your CBG is greater than 220 mg/dL, you may take  of your sliding scale (correction) dose of insulin.   HOW TO MANAGE YOUR DIABETES BEFORE AND AFTER SURGERY  Why is it important to control my blood sugar before and after surgery? Improving blood sugar levels before and after surgery helps healing and can limit problems. A way of improving blood sugar control is eating a healthy diet by:  Eating  less sugar and carbohydrates  Increasing activity/exercise  Talking with your doctor about reaching your blood sugar goals High blood sugars (greater than 180 mg/dL) can raise your risk of infections and slow your recovery, so you will need to focus on controlling your diabetes during the weeks before surgery. Make sure that the doctor who takes care of your diabetes knows about your planned surgery including the date and location.  How do I manage my blood sugar before surgery? Check your blood sugar at least 4 times a day, starting 2 days before surgery, to make sure that the level is not too high or low.  Check your blood sugar the morning of your surgery when you wake up and every 2 hours until you get to the Short Stay unit.  If your blood sugar is less than 70 mg/dL, you will need to treat for low blood sugar: Do not take insulin. Treat a low blood sugar (less than 70 mg/dL) with  cup of clear juice (cranberry or apple), 4 glucose tablets, OR glucose gel. Recheck blood sugar in 15 minutes after treatment (to make sure it is greater than 70 mg/dL). If your blood sugar is not greater than 70 mg/dL on recheck, call [phone] for further instructions. Report your blood sugar to the short stay nurse when you get to Short Stay.  If you are admitted to the hospital after surgery: Your blood sugar will be checked by the staff and you will probably be given insulin after surgery (instead of oral diabetes medicines) to make sure you have good blood sugar levels. The goal for blood sugar control after surgery is  80-180 mg/dL.   One week prior to surgery, STOP taking any Aspirin (unless otherwise instructed by your surgeon) Aleve, Naproxen, Ibuprofen, Motrin, Advil, Goody's, BC's, all herbal medications, fish oil, and non-prescription vitamins.                     Do NOT Smoke (Tobacco/Vaping) for 24 hours prior to your procedure.  If you use a CPAP at night, you may bring your mask/headgear  for your overnight stay.   You will be asked to remove any contacts, glasses, piercing's, hearing aid's, dentures/partials prior to surgery. Please bring cases for these items if needed.    Your surgeon will determine if you are to be admitted or discharged the same day.  Patients discharged the day of surgery will not be allowed to drive home, and someone needs to stay with them for 24 hours.  SURGICAL WAITING ROOM VISITATION Patients may have no more than 2 support people in the waiting area - these visitors may rotate.   Pre-op nurse will coordinate an appropriate time for 2 ADULT support persons, who may not rotate, to accompany patient in pre-op.  Children under the age of 10 must have an adult with them who is not the patient and must remain in the main waiting area with an adult.  If the patient needs to stay at the hospital during part of their recovery, the visitor guidelines for inpatient rooms apply.  Please refer to the [facility] website for the visitor guidelines for any additional information.   If you received a COVID test during your pre-op visit  it is requested that you wear a mask when out in public, stay away from anyone that may not be feeling well and notify your surgeon if you develop symptoms. If you have been in contact with anyone that has tested positive in the last 10 days please notify you surgeon.      Pre-operative CHG Bathing Instructions   You can play a key role in reducing the risk of infection after surgery. Your skin needs to be as free of germs as possible. You can reduce the number of germs on your skin by washing with CHG (chlorhexidine gluconate) soap before surgery. CHG is an antiseptic soap that kills germs and continues to kill germs even after washing.   DO NOT use if you have an allergy to chlorhexidine/CHG or antibacterial soaps. If your skin becomes reddened or irritated, stop using the CHG and notify one of our RNs at [phone].               TAKE A SHOWER THE NIGHT BEFORE SURGERY   Please keep in mind the following:  DO NOT shave, including legs and underarms, 48 hours prior to surgery.   You may shave your face before/day of surgery.  Place clean sheets on your bed the night before surgery Use a clean washcloth (not used since being washed) for shower. DO NOT sleep with pet's night before surgery.  CHG Shower Instructions:  Wash your face and private area with normal soap. If you choose to wash your hair, wash first with your normal shampoo.  After you use shampoo/soap, rinse your hair and body thoroughly to remove shampoo/soap residue.  Turn the water OFF and apply half the bottle of CHG soap to a CLEAN washcloth.  Apply CHG soap ONLY FROM YOUR NECK DOWN TO YOUR TOES (washing for 3-5 minutes)  DO NOT use CHG soap on face, private areas,  open wounds, or sores.  Pay special attention to the area where your surgery is being performed.  If you are having back surgery, having someone wash your back for you may be helpful. Wait 2 minutes after CHG soap is applied, then you may rinse off the CHG soap.  Pat dry with a clean towel  Put on clean pajamas    Additional instructions for the day of surgery: If you choose, you may shower the morning of surgery with an antibacterial soap.  DO NOT APPLY any lotions, deodorants, cologne, or perfumes.   Do not wear jewelry or makeup Do not wear nail polish, gel polish, artificial nails, or any other type of covering on natural nails (fingers and toes) Do not bring valuables to the hospital. [facility] is not responsible for valuables/personal belongings. Put on clean/comfortable clothes.  Please brush your teeth.  Ask your nurse before applying any prescription medications to the skin.

## 2025-01-19 ENCOUNTER — Encounter (HOSPITAL_COMMUNITY): Payer: Self-pay

## 2025-01-19 ENCOUNTER — Other Ambulatory Visit: Payer: Self-pay

## 2025-01-19 ENCOUNTER — Encounter (HOSPITAL_COMMUNITY)
Admission: RE | Admit: 2025-01-19 | Discharge: 2025-01-19 | Disposition: A | Payer: Self-pay | Source: Ambulatory Visit | Attending: General Surgery

## 2025-01-19 VITALS — BP 129/71 | HR 68 | Temp 98.8°F | Resp 17 | Ht 69.0 in | Wt 170.8 lb

## 2025-01-19 DIAGNOSIS — E119 Type 2 diabetes mellitus without complications: Secondary | ICD-10-CM | POA: Insufficient documentation

## 2025-01-19 DIAGNOSIS — Z01818 Encounter for other preprocedural examination: Secondary | ICD-10-CM | POA: Insufficient documentation

## 2025-01-19 HISTORY — DX: Other complications of anesthesia, initial encounter: T88.59XA

## 2025-01-19 HISTORY — DX: Chronic obstructive pulmonary disease, unspecified: J44.9

## 2025-01-19 HISTORY — DX: Inflammatory liver disease, unspecified: K75.9

## 2025-01-19 HISTORY — DX: Peripheral vascular disease, unspecified: I73.9

## 2025-01-19 HISTORY — DX: Nausea with vomiting, unspecified: R11.2

## 2025-01-19 HISTORY — DX: Myoneural disorder, unspecified: G70.9

## 2025-01-19 HISTORY — DX: Gastro-esophageal reflux disease without esophagitis: K21.9

## 2025-01-19 HISTORY — DX: Cardiac arrhythmia, unspecified: I49.9

## 2025-01-19 LAB — GLUCOSE, CAPILLARY: Glucose-Capillary: 99 mg/dL (ref 70–99)

## 2025-01-19 LAB — COMPREHENSIVE METABOLIC PANEL WITH GFR
ALT: 39 U/L (ref 0–44)
AST: 29 U/L (ref 15–41)
Albumin: 4.5 g/dL (ref 3.5–5.0)
Alkaline Phosphatase: 62 U/L (ref 38–126)
Anion gap: 8 (ref 5–15)
BUN: 13 mg/dL (ref 8–23)
CO2: 29 mmol/L (ref 22–32)
Calcium: 9.3 mg/dL (ref 8.9–10.3)
Chloride: 98 mmol/L (ref 98–111)
Creatinine, Ser: 0.78 mg/dL (ref 0.61–1.24)
GFR, Estimated: >60 mL/min
Glucose, Bld: 96 mg/dL (ref 70–99)
Potassium: 4.4 mmol/L (ref 3.5–5.1)
Sodium: 135 mmol/L (ref 135–145)
Total Bilirubin: 0.6 mg/dL (ref 0.0–1.2)
Total Protein: 7 g/dL (ref 6.5–8.1)

## 2025-01-19 LAB — CBC
HCT: 44.9 % (ref 39.0–52.0)
Hemoglobin: 15.3 g/dL (ref 13.0–17.0)
MCH: 31.1 pg (ref 26.0–34.0)
MCHC: 34.1 g/dL (ref 30.0–36.0)
MCV: 91.3 fL (ref 80.0–100.0)
Platelets: 183 10*3/uL (ref 150–400)
RBC: 4.92 MIL/uL (ref 4.22–5.81)
RDW: 12.9 % (ref 11.5–15.5)
WBC: 5.6 10*3/uL (ref 4.0–10.5)
nRBC: 0 % (ref 0.0–0.2)

## 2025-01-19 LAB — HEMOGLOBIN A1C
Hgb A1c MFr Bld: 6.3 % — ABNORMAL HIGH (ref 4.8–5.6)
Mean Plasma Glucose: 134.11 mg/dL

## 2025-01-19 NOTE — Progress Notes (Signed)
 Surgical Instructions   Your procedure is scheduled on Febuary 3, 2026. Report to [facility] Main Entrance A at 5:30 A.M., then check in with the Admitting office. Any questions or running late day of surgery: call [phone]  Questions prior to your surgery date: call [phone], Monday-Friday, 8am-4pm. If you experience any cold or flu symptoms such as cough, fever, chills, shortness of breath, etc. between now and your scheduled surgery, please notify us  at the above number.     Remember:  Do not eat after midnight the night before your surgery   You may drink clear liquids until 4:30 AM the morning of your surgery.   Clear liquids allowed are: Water, Non-Citrus Juices (without pulp), Carbonated Beverages, Clear Tea (no milk, honey, etc.), Black Coffee Only (NO MILK, CREAM OR POWDERED CREAMER of any kind), and Gatorade.    Take these medicines the morning of surgery with A SIP OF WATER  citalopram  (CELEXA )  atorvastatin  (LIPITOR) ezetimibe (ZETIA)  gabapentin  (NEURONTIN )  pantoprazole  (PROTONIX )  sucralfate (CARAFATE)  tamsulosin  (FLOMAX )    May take these medicines IF NEEDED: acetaminophen  (TYLENOL )  hydrOXYzine  (ATARAX )  ondansetron  (ZOFRAN )    One week prior to surgery, STOP taking any Aspirin (unless otherwise instructed by your surgeon) Aleve, Naproxen, Ibuprofen , Motrin , Advil , Goody's, BC's, all herbal medications, fish oil, and non-prescription vitamins.   WHAT DO I DO ABOUT MY DIABETES MEDICATION?   STOP taking your empagliflozin (JARDIANCE) three days prior to surgery. Your last dose will be February 28th.       HOW TO MANAGE YOUR DIABETES BEFORE AND AFTER SURGERY  Why is it important to control my blood sugar before and after surgery? Improving blood sugar levels before and after surgery helps healing and can limit problems. A way of improving blood sugar control is eating a healthy diet by:  Eating less sugar and carbohydrates  Increasing  activity/exercise  Talking with your doctor about reaching your blood sugar goals High blood sugars (greater than 180 mg/dL) can raise your risk of infections and slow your recovery, so you will need to focus on controlling your diabetes during the weeks before surgery. Make sure that the doctor who takes care of your diabetes knows about your planned surgery including the date and location.  How do I manage my blood sugar before surgery? Check your blood sugar at least 4 times a day, starting 2 days before surgery, to make sure that the level is not too high or low.  Check your blood sugar the morning of your surgery when you wake up and every 2 hours until you get to the Short Stay unit.  If your blood sugar is less than 70 mg/dL, you will need to treat for low blood sugar: Do not take insulin . Treat a low blood sugar (less than 70 mg/dL) with  cup of clear juice (cranberry or apple), 4 glucose tablets, OR glucose gel. Recheck blood sugar in 15 minutes after treatment (to make sure it is greater than 70 mg/dL). If your blood sugar is not greater than 70 mg/dL on recheck, call [phone] for further instructions. Report your blood sugar to the short stay nurse when you get to Short Stay.  If you are admitted to the hospital after surgery: Your blood sugar will be checked by the staff and you will probably be given insulin  after surgery (instead of oral diabetes medicines) to make sure you have good blood sugar levels. The goal for blood sugar control after surgery is 80-180 mg/dL.  Do NOT Smoke (Tobacco/Vaping) for 24 hours prior to your procedure.  If you use a CPAP at night, you may bring your mask/headgear for your overnight stay.   You will be asked to remove any contacts, glasses, piercing's, hearing aid's, dentures/partials prior to surgery. Please bring cases for these items if needed.    Your surgeon will determine if you are to be admitted or discharged the same  day.  Patients discharged the day of surgery will not be allowed to drive home, and someone needs to stay with them for 24 hours.  SURGICAL WAITING ROOM VISITATION Patients may have no more than 2 support people in the waiting area - these visitors may rotate.   Pre-op nurse will coordinate an appropriate time for 2 ADULT support persons, who may not rotate, to accompany patient in pre-op.  Children under the age of 65 must have an adult with them who is not the patient and must remain in the main waiting area with an adult.  If the patient needs to stay at the hospital during part of their recovery, the visitor guidelines for inpatient rooms apply.  Please refer to the [facility] website for the visitor guidelines for any additional information.   If you received a COVID test during your pre-op visit  it is requested that you wear a mask when out in public, stay away from anyone that may not be feeling well and notify your surgeon if you develop symptoms. If you have been in contact with anyone that has tested positive in the last 10 days please notify you surgeon.      Pre-operative CHG Bathing Instructions   You can play a key role in reducing the risk of infection after surgery. Your skin needs to be as free of germs as possible. You can reduce the number of germs on your skin by washing with CHG (chlorhexidine  gluconate) soap before surgery. CHG is an antiseptic soap that kills germs and continues to kill germs even after washing.   DO NOT use if you have an allergy to chlorhexidine /CHG or antibacterial soaps. If your skin becomes reddened or irritated, stop using the CHG and notify one of our RNs at [phone].              TAKE A SHOWER THE NIGHT BEFORE SURGERY   Please keep in mind the following:  DO NOT shave, including legs and underarms, 48 hours prior to surgery.   You may shave your face before/day of surgery.  Place clean sheets on your bed the night before surgery Use a  clean washcloth (not used since being washed) for shower. DO NOT sleep with pet's night before surgery.  CHG Shower Instructions:  Wash your face and private area with normal soap. If you choose to wash your hair, wash first with your normal shampoo.  After you use shampoo/soap, rinse your hair and body thoroughly to remove shampoo/soap residue.  Turn the water OFF and apply half the bottle of CHG soap to a CLEAN washcloth.  Apply CHG soap ONLY FROM YOUR NECK DOWN TO YOUR TOES (washing for 3-5 minutes)  DO NOT use CHG soap on face, private areas, open wounds, or sores.  Pay special attention to the area where your surgery is being performed.  If you are having back surgery, having someone wash your back for you may be helpful. Wait 2 minutes after CHG soap is applied, then you may rinse off the CHG soap.  Pat dry with  a clean towel  Put on clean pajamas    Additional instructions for the day of surgery: If you choose, you may shower the morning of surgery with an antibacterial soap.  DO NOT APPLY any lotions, deodorants, cologne, or perfumes.   Do not wear jewelry or makeup Do not wear nail polish, gel polish, artificial nails, or any other type of covering on natural nails (fingers and toes) Do not bring valuables to the hospital. [facility] is not responsible for valuables/personal belongings. Put on clean/comfortable clothes.  Please brush your teeth.  Ask your nurse before applying any prescription medications to the skin.

## 2025-01-19 NOTE — Progress Notes (Signed)
 PCP - Dr. Alm Na Cardiologist - Denies  PPM/ICD - Denies Device Orders - n/a Rep Notified - n/a  Chest x-ray - Denies  EKG - 01/19/2025 Stress Test - Requested from [facility] - Requested from [facility] Cardiac Cath - Denies  Sleep Study - +OSA, but pt unable to tolerate CPAP  Pt is DM2. He has a glucose meter at home with supplies, but does not check, therefore unknown fasting glucose. CBG at pre-op appointment 99. A1c result pending.  Last dose of GLP1 agonist- n/a GLP1 instructions: n/a  Blood Thinner Instructions: n/a Aspirin Instructions: n/a  ERAS Protcol - Clear liquids until 0430 morning of surgery PRE-SURGERY Ensure or G2- n/a  COVID TEST- n/a   Anesthesia review: Yes. Abnormal EKG review. Pt states [facility], most likely EKG, stress test and echo. Results requested. Pt instructed to hold Jardiance three days. Last dose will be today, January 30th.  Patient denies shortness of breath, fever, cough and chest pain at PAT appointment. Pt denies any respiratory illness/infection in the last two months.    All instructions explained to the patient, with a verbal understanding of the material. Patient agrees to go over the instructions while at home for a better understanding. Patient also instructed to self quarantine after being tested for COVID-19. The opportunity to ask questions was provided.

## 2025-01-23 ENCOUNTER — Encounter (HOSPITAL_COMMUNITY): Admitting: Physician Assistant

## 2025-01-23 ENCOUNTER — Encounter (HOSPITAL_COMMUNITY): Payer: Self-pay | Admitting: General Surgery

## 2025-01-23 ENCOUNTER — Encounter (HOSPITAL_COMMUNITY): Admission: RE | Disposition: A | Payer: Self-pay | Source: Home / Self Care | Attending: General Surgery

## 2025-01-23 ENCOUNTER — Observation Stay (HOSPITAL_COMMUNITY)
Admission: RE | Admit: 2025-01-23 | Discharge: 2025-01-24 | Disposition: A | Payer: Self-pay | Attending: General Surgery | Admitting: General Surgery

## 2025-01-23 ENCOUNTER — Ambulatory Visit (HOSPITAL_COMMUNITY)

## 2025-01-23 ENCOUNTER — Other Ambulatory Visit: Payer: Self-pay

## 2025-01-23 DIAGNOSIS — Z7982 Long term (current) use of aspirin: Secondary | ICD-10-CM | POA: Insufficient documentation

## 2025-01-23 DIAGNOSIS — J449 Chronic obstructive pulmonary disease, unspecified: Secondary | ICD-10-CM | POA: Insufficient documentation

## 2025-01-23 DIAGNOSIS — E042 Nontoxic multinodular goiter: Principal | ICD-10-CM | POA: Diagnosis present

## 2025-01-23 DIAGNOSIS — Z79899 Other long term (current) drug therapy: Secondary | ICD-10-CM | POA: Insufficient documentation

## 2025-01-23 DIAGNOSIS — I10 Essential (primary) hypertension: Secondary | ICD-10-CM | POA: Insufficient documentation

## 2025-01-23 DIAGNOSIS — Z87891 Personal history of nicotine dependence: Secondary | ICD-10-CM | POA: Insufficient documentation

## 2025-01-23 DIAGNOSIS — E119 Type 2 diabetes mellitus without complications: Secondary | ICD-10-CM | POA: Insufficient documentation

## 2025-01-23 DIAGNOSIS — E063 Autoimmune thyroiditis: Secondary | ICD-10-CM | POA: Insufficient documentation

## 2025-01-23 LAB — GLUCOSE, CAPILLARY
Glucose-Capillary: 126 mg/dL — ABNORMAL HIGH (ref 70–99)
Glucose-Capillary: 152 mg/dL — ABNORMAL HIGH (ref 70–99)
Glucose-Capillary: 152 mg/dL — ABNORMAL HIGH (ref 70–99)
Glucose-Capillary: 179 mg/dL — ABNORMAL HIGH (ref 70–99)
Glucose-Capillary: 147 mg/dL — ABNORMAL HIGH (ref 70–99)

## 2025-01-23 MED ORDER — MIDAZOLAM HCL (PF) 2 MG/2ML IJ SOLN
INTRAMUSCULAR | Status: DC | PRN
  Administered 2025-01-23: 2 mg via INTRAVENOUS

## 2025-01-23 MED ORDER — ONDANSETRON 4 MG PO TBDP: 4.0000 mg | ORAL_TABLET | Freq: Four times a day (QID) | ORAL | Status: DC | PRN

## 2025-01-23 MED ORDER — OXYCODONE HCL 5 MG PO TABS: 5.0000 mg | ORAL_TABLET | Freq: Once | ORAL | Status: DC | PRN

## 2025-01-23 MED ORDER — CHLORHEXIDINE GLUCONATE 0.12 % MT SOLN
15.0000 mL | Freq: Once | OROMUCOSAL | Status: AC
  Administered 2025-01-23: 15 mL via OROMUCOSAL
  Filled 2025-01-23: qty 15

## 2025-01-23 MED ORDER — FENTANYL CITRATE (PF) 100 MCG/2ML IJ SOLN: 25.0000 ug | INTRAMUSCULAR | Status: DC | PRN

## 2025-01-23 MED ORDER — TAMSULOSIN HCL 0.4 MG PO CAPS
0.4000 mg | ORAL_CAPSULE | Freq: Every day | ORAL | Status: DC
  Administered 2025-01-23: 0.4 mg via ORAL
  Filled 2025-01-23: qty 1

## 2025-01-23 MED ORDER — ACETAMINOPHEN 10 MG/ML IV SOLN: 1000.0000 mg | Freq: Once | INTRAVENOUS | Status: DC | PRN

## 2025-01-23 MED ORDER — DIPHENHYDRAMINE HCL 50 MG/ML IJ SOLN: 12.5000 mg | Freq: Four times a day (QID) | INTRAMUSCULAR | Status: DC | PRN

## 2025-01-23 MED ORDER — CITALOPRAM HYDROBROMIDE 20 MG PO TABS: 20.0000 mg | ORAL_TABLET | Freq: Every day | ORAL | Status: DC

## 2025-01-23 MED ORDER — BUPIVACAINE-EPINEPHRINE (PF) 0.25% -1:200000 IJ SOLN
INTRAMUSCULAR | Status: AC
  Filled 2025-01-23: qty 30

## 2025-01-23 MED ORDER — FENTANYL CITRATE (PF) 250 MCG/5ML IJ SOLN
INTRAMUSCULAR | Status: AC
  Filled 2025-01-23: qty 5

## 2025-01-23 MED ORDER — PROPOFOL 10 MG/ML IV BOLUS
INTRAVENOUS | Status: DC | PRN
  Administered 2025-01-23 (×2): 100 mg via INTRAVENOUS

## 2025-01-23 MED ORDER — DIPHENHYDRAMINE HCL 12.5 MG/5ML PO ELIX: 12.5000 mg | ORAL_SOLUTION | Freq: Four times a day (QID) | ORAL | Status: DC | PRN

## 2025-01-23 MED ORDER — LIDOCAINE-EPINEPHRINE 1 %-1:100000 IJ SOLN
INTRAMUSCULAR | Status: AC
  Filled 2025-01-23: qty 1

## 2025-01-23 MED ORDER — CHLORHEXIDINE GLUCONATE CLOTH 2 % EX PADS: 6.0000 | MEDICATED_PAD | Freq: Once | CUTANEOUS | Status: DC

## 2025-01-23 MED ORDER — LISINOPRIL 10 MG PO TABS
10.0000 mg | ORAL_TABLET | Freq: Every day | ORAL | Status: DC
  Administered 2025-01-23: 10 mg via ORAL
  Filled 2025-01-23: qty 1

## 2025-01-23 MED ORDER — ALBUMIN HUMAN 5 % IV SOLN: INTRAVENOUS | Status: DC | PRN

## 2025-01-23 MED ORDER — FENTANYL CITRATE (PF) 250 MCG/5ML IJ SOLN
INTRAMUSCULAR | Status: DC | PRN
  Administered 2025-01-23 (×2): 50 ug via INTRAVENOUS
  Administered 2025-01-23: 100 ug via INTRAVENOUS
  Administered 2025-01-23 (×2): 50 ug via INTRAVENOUS

## 2025-01-23 MED ORDER — CELECOXIB 200 MG PO CAPS
400.0000 mg | ORAL_CAPSULE | ORAL | Status: AC
  Administered 2025-01-23: 400 mg via ORAL
  Filled 2025-01-23: qty 2

## 2025-01-23 MED ORDER — ACETAMINOPHEN 500 MG PO TABS
1000.0000 mg | ORAL_TABLET | ORAL | Status: AC
  Administered 2025-01-23: 1000 mg via ORAL
  Filled 2025-01-23: qty 2

## 2025-01-23 MED ORDER — ONDANSETRON HCL 4 MG/2ML IJ SOLN
INTRAMUSCULAR | Status: DC | PRN
  Administered 2025-01-23: 4 mg via INTRAVENOUS

## 2025-01-23 MED ORDER — SODIUM CHLORIDE 0.9 % IV SOLN: INTRAVENOUS | Status: DC

## 2025-01-23 MED ORDER — GABAPENTIN 300 MG PO CAPS
300.0000 mg | ORAL_CAPSULE | Freq: Three times a day (TID) | ORAL | Status: DC
  Administered 2025-01-23 (×2): 300 mg via ORAL
  Filled 2025-01-23 (×2): qty 1

## 2025-01-23 MED ORDER — ORAL CARE MOUTH RINSE: 15.0000 mL | Freq: Once | OROMUCOSAL | Status: AC

## 2025-01-23 MED ORDER — CELECOXIB 200 MG PO CAPS
200.0000 mg | ORAL_CAPSULE | Freq: Two times a day (BID) | ORAL | Status: DC
  Administered 2025-01-23 (×2): 200 mg via ORAL
  Filled 2025-01-23 (×4): qty 1

## 2025-01-23 MED ORDER — ROCURONIUM BROMIDE 10 MG/ML (PF) SYRINGE
PREFILLED_SYRINGE | INTRAVENOUS | Status: DC | PRN
  Administered 2025-01-23: 30 mg via INTRAVENOUS
  Administered 2025-01-23: 60 mg via INTRAVENOUS
  Administered 2025-01-23: 40 mg via INTRAVENOUS

## 2025-01-23 MED ORDER — MIDAZOLAM HCL 2 MG/2ML IJ SOLN
INTRAMUSCULAR | Status: AC
  Filled 2025-01-23: qty 2

## 2025-01-23 MED ORDER — HYDROMORPHONE HCL 1 MG/ML IJ SOLN
0.5000 mg | INTRAMUSCULAR | Status: DC | PRN
  Administered 2025-01-23: 0.5 mg via INTRAVENOUS
  Filled 2025-01-23 (×2): qty 0.5

## 2025-01-23 MED ORDER — SUCCINYLCHOLINE CHLORIDE 200 MG/10ML IV SOSY
PREFILLED_SYRINGE | INTRAVENOUS | Status: DC | PRN
  Administered 2025-01-23: 100 mg via INTRAVENOUS

## 2025-01-23 MED ORDER — BUPIVACAINE HCL 0.25 % IJ SOLN
INTRAMUSCULAR | Status: DC | PRN
  Administered 2025-01-23: 30 mL

## 2025-01-23 MED ORDER — CEFAZOLIN SODIUM-DEXTROSE 2-4 GM/100ML-% IV SOLN
2.0000 g | INTRAVENOUS | Status: AC
  Administered 2025-01-23: 2 g via INTRAVENOUS
  Filled 2025-01-23: qty 100

## 2025-01-23 MED ORDER — CITALOPRAM HYDROBROMIDE 20 MG PO TABS: 40.0000 mg | ORAL_TABLET | Freq: Every day | ORAL | Status: DC

## 2025-01-23 MED ORDER — HYDROXYZINE HCL 25 MG PO TABS: 25.0000 mg | ORAL_TABLET | Freq: Four times a day (QID) | ORAL | Status: DC | PRN

## 2025-01-23 MED ORDER — TRAMADOL HCL 50 MG PO TABS
50.0000 mg | ORAL_TABLET | Freq: Four times a day (QID) | ORAL | Status: DC | PRN
  Administered 2025-01-23: 50 mg via ORAL
  Filled 2025-01-23: qty 1

## 2025-01-23 MED ORDER — INSULIN ASPART 100 UNIT/ML IJ SOLN: 0.0000 [IU] | INTRAMUSCULAR | Status: DC | PRN

## 2025-01-23 MED ORDER — DROPERIDOL 2.5 MG/ML IJ SOLN: 0.6250 mg | Freq: Once | INTRAMUSCULAR | Status: DC | PRN

## 2025-01-23 MED ORDER — PANTOPRAZOLE SODIUM 20 MG PO TBEC
20.0000 mg | DELAYED_RELEASE_TABLET | Freq: Two times a day (BID) | ORAL | Status: DC
  Administered 2025-01-23 (×2): 20 mg via ORAL
  Filled 2025-01-23 (×2): qty 1

## 2025-01-23 MED ORDER — PHENYLEPHRINE 80 MCG/ML (10ML) SYRINGE FOR IV PUSH (FOR BLOOD PRESSURE SUPPORT)
PREFILLED_SYRINGE | INTRAVENOUS | Status: DC | PRN
  Administered 2025-01-23: 80 ug via INTRAVENOUS
  Administered 2025-01-23 (×4): 160 ug via INTRAVENOUS

## 2025-01-23 MED ORDER — SUGAMMADEX SODIUM 200 MG/2ML IV SOLN
INTRAVENOUS | Status: DC | PRN
  Administered 2025-01-23: 200 mg via INTRAVENOUS

## 2025-01-23 MED ORDER — OXYCODONE HCL 5 MG/5ML PO SOLN: 5.0000 mg | Freq: Once | ORAL | Status: DC | PRN

## 2025-01-23 MED ORDER — LACTATED RINGERS IV SOLN: INTRAVENOUS | Status: DC

## 2025-01-23 MED ORDER — PROPOFOL 10 MG/ML IV BOLUS
INTRAVENOUS | Status: AC
  Filled 2025-01-23: qty 20

## 2025-01-23 MED ORDER — DEXAMETHASONE SOD PHOSPHATE PF 10 MG/ML IJ SOLN
INTRAMUSCULAR | Status: DC | PRN
  Administered 2025-01-23: 4 mg via INTRAVENOUS

## 2025-01-23 MED ORDER — SIMETHICONE 80 MG PO CHEW: 40.0000 mg | CHEWABLE_TABLET | Freq: Four times a day (QID) | ORAL | Status: DC | PRN

## 2025-01-23 MED ORDER — PHENYLEPHRINE HCL-NACL 20-0.9 MG/250ML-% IV SOLN
INTRAVENOUS | Status: DC | PRN
  Administered 2025-01-23: 30 ug/min via INTRAVENOUS

## 2025-01-23 MED ORDER — ONDANSETRON HCL 4 MG/2ML IJ SOLN: 4.0000 mg | Freq: Four times a day (QID) | INTRAMUSCULAR | Status: DC | PRN

## 2025-01-23 NOTE — Transfer of Care (Signed)
 Immediate Anesthesia Transfer of Care Note  Patient: William Sexton  Procedure(s) Performed: THYROIDECTOMY  Patient Location: PACU  Anesthesia Type:General  Level of Consciousness: awake, alert , and oriented  Airway & Oxygen Therapy: Patient Spontanous Breathing and Patient connected to nasal cannula oxygen  Post-op Assessment: Report given to RN and Post -op Vital signs reviewed and stable  Post vital signs: Reviewed and stable  Last Vitals:  Vitals Value Taken Time  BP 138/67 01/23/25 10:05  Temp    Pulse 80 01/23/25 10:06  Resp 14 01/23/25 10:06  SpO2 98 % 01/23/25 10:06  Vitals shown include unfiled device data.  Last Pain:  Vitals:   01/23/25 0629  TempSrc:   PainSc: 0-No pain         Complications: No notable events documented.

## 2025-01-23 NOTE — H&P (Signed)
 " Chief Complaint  Patient presents with  Goiter   Subjective   William Sexton is a 69 y.o. male new patient in today for: History of Present Illness William Sexton is a 69 year old male who presents with swallowing difficulties due to thyroid  issues.  He has a sensation of blockage in his throat and feels his esophagus is not functioning properly. He was told his thyroid  is pressing on his trachea and affecting his airway.  He is concerned about potential weight gain, but there has been no significant change and he has never been overweight.  His sister had a similar thyroid  condition treated initially with radioactive therapy, followed by surgical removal.  Social Drivers of Health with Concerns   Tobacco Use: Medium Risk (11/30/2024)  Patient History  Smoking Tobacco Use: Former  Smokeless Tobacco Use: Unknown  Received from [employer]  Social Network  Housing Stability: High Risk (05/24/2024)  Housing Stability Vital Sign  Unable to Pay for Housing in the Last Year: Yes  Homeless in the Last Year: No   05/24/2024  [employer] Authorization for Release of Information - [employer]   Are any of your needs urgent? No  Would you like help with any of the needs that you have identified? No  Permission to provide our community partners with your name, address, and phone number so they can contact you Accept  Signature of Patient / Personal Representative William Sexton  Date 05/24/2024   Outpatient Medications Prior to Visit  Medication Sig Dispense Refill  aspirin 81 MG EC tablet Take 81 mg by mouth once daily  atorvastatin  (LIPITOR) 40 MG tablet TAKE 1 TABLET BY MOUTH EVERY DAY 90 tablet 0  celecoxib  (CELEBREX ) 100 MG capsule TAKE ONE CAPSULE BY MOUTH DAILY AS NEEDED FOR 7 TO 10 DAYS FOR FLARE. MAY INTERFERE WITH EFFECTIVENESS OF ASPIRIN.  CITALOPRAM  HYDROBROMIDE (CITALOPRAM  ORAL) Take 40 mg by mouth once daily  gabapentin  (NEURONTIN ) 300 MG capsule TAKE ONE CAPSULE BY MOUTH  THREE TIMES A DAY FOR PAIN; IF DROWSY, AVOID DRIVING, OPERATING MACHINERY, CLIMBING LADDERS; AVOID TAKING SAME TIME AS OTHER SEDATING MEDS OR ALCOHOL; HOLD DOSE FOR EXCESS SEDATION OR DIFFICULTY BREATHING  lisinopriL  (ZESTRIL ) 20 MG tablet Take 20 mg by mouth once daily  tamsulosin  (FLOMAX ) 0.4 mg capsule TAKE 1 CAPSULE (0.4 MG TOTAL) BY MOUTH ONCE DAILY 30 MINUTES AFTER THE SAME MEAL 90 capsule 1   No facility-administered medications prior to visit.    Objective   Vitals:  11/30/24 0920  BP: 138/78  Pulse: 69  Temp: 36.8 C (98.2 F)  SpO2: 96%  Weight: 81.6 kg (179 lb 12.8 oz)  Height: 175.3 cm (5' 9)  PainSc: 2   Body mass index is 26.55 kg/m. Physical Exam Constitutional:  Appearance: Normal appearance.  HENT:  Head: Normocephalic and atraumatic.  Neck:  Comments: Large left sided thyroid  nodule, able to palpate inferior edge of gland, moderate right sided thyroid  Pulmonary:  Effort: Pulmonary effort is normal.  Musculoskeletal:  General: Normal range of motion.  Cervical back: Normal range of motion.  Neurological:  General: No focal deficit present.  Mental Status: He is alert and oriented to person, place, and time. Mental status is at baseline.  Psychiatric:  Mood and Affect: Mood normal.  Behavior: Behavior normal.  Thought Content: Thought content normal.     I reviewed CT scan images showing large right sided thyroid  with multiple nodules and some small nodules in the left side with movement of the  airway to the right.  Assessment/Plan:   Assessment & Plan Thyroid  goiter with compressive symptoms Thyroid  goiter causing airway obstruction and swallowing difficulties. Surgical intervention preferred due to size and symptoms. - Proceed with total thyroidectomy after Christmas. - Coordinate surgery with two surgeons for safety and precision. - Monitor for airway swelling and bleeding post-operatively. - Provide overnight hospital observation  post-surgery.  Planned total thyroidectomy Total thyroidectomy planned to relieve compressive symptoms. Procedure involves complete thyroid  removal with nerve preservation to prevent hoarseness. Less than 1% risk of nerve injury. Monitor for hypocalcemia due to potential parathyroid impact. Lifelong thyroid  replacement therapy required. - Perform total thyroidectomy with two surgeons. - Administer lifelong thyroid  replacement medication post-surgery. - Monitor calcium  levels post-surgery; provide supplementation if necessary. - Educate on taking thyroid  medication on an empty stomach. - Monitor for hypocalcemia symptoms post-surgery.  Diagnoses and all orders for this visit:  "

## 2025-01-23 NOTE — Anesthesia Procedure Notes (Signed)
 Procedure Name: Intubation Date/Time: 01/23/2025 7:31 AM  Performed by: Lockie Flesher, CRNAPre-anesthesia Checklist: Patient identified, Emergency Drugs available, Suction available and Patient being monitored Patient Re-evaluated:Patient Re-evaluated prior to induction Oxygen Delivery Method: Circle System Utilized Preoxygenation: Pre-oxygenation with 100% oxygen Induction Type: IV induction Ventilation: Mask ventilation without difficulty Laryngoscope Size: Mac and 3 Grade View: Grade II Tube type: Oral Tube size: 7.5 mm Number of attempts: 1 Airway Equipment and Method: Stylet and Oral airway Placement Confirmation: ETT inserted through vocal cords under direct vision, positive ETCO2 and breath sounds checked- equal and bilateral Secured at: 23 cm Tube secured with: Tape Dental Injury: Teeth and Oropharynx as per pre-operative assessment

## 2025-01-23 NOTE — Anesthesia Postprocedure Evaluation (Signed)
"   Anesthesia Post Note  Patient: William Sexton  Procedure(s) Performed: THYROIDECTOMY     Patient location during evaluation: PACU Anesthesia Type: General Level of consciousness: awake and alert Pain management: pain level controlled Vital Signs Assessment: post-procedure vital signs reviewed and stable Respiratory status: spontaneous breathing, nonlabored ventilation, respiratory function stable and patient connected to nasal cannula oxygen Cardiovascular status: blood pressure returned to baseline and stable Postop Assessment: no apparent nausea or vomiting Anesthetic complications: no   No notable events documented.  Last Vitals:  Vitals:   01/23/25 1015 01/23/25 1057  BP: 131/65 (!) 149/59  Pulse: 79 77  Resp: 17 16  Temp:  37.1 C  SpO2: 95% 95%    Last Pain:  Vitals:   01/23/25 1057  TempSrc: Oral  PainSc:                  Thom JONELLE Peoples      "

## 2025-01-23 NOTE — OR Nursing (Signed)
 During wakeup from anesthesia, patient started to cough and immediate swelling was noted around the incision about 2 inches in diameter. Dr Stevie was notified immediately. Decision was made to re-explore the wound by Dr Stevie.  Patient was redraped and reprepped. All counts were correct.

## 2025-01-24 ENCOUNTER — Other Ambulatory Visit (HOSPITAL_COMMUNITY): Payer: Self-pay

## 2025-01-24 ENCOUNTER — Encounter (HOSPITAL_COMMUNITY): Payer: Self-pay | Admitting: General Surgery

## 2025-01-24 LAB — CBC
HCT: 38.7 % — ABNORMAL LOW (ref 39.0–52.0)
Hemoglobin: 13.2 g/dL (ref 13.0–17.0)
MCH: 31.3 pg (ref 26.0–34.0)
MCHC: 34.1 g/dL (ref 30.0–36.0)
MCV: 91.7 fL (ref 80.0–100.0)
Platelets: 169 10*3/uL (ref 150–400)
RBC: 4.22 MIL/uL (ref 4.22–5.81)
RDW: 13.2 % (ref 11.5–15.5)
WBC: 9.7 10*3/uL (ref 4.0–10.5)
nRBC: 0 % (ref 0.0–0.2)

## 2025-01-24 LAB — GLUCOSE, CAPILLARY: Glucose-Capillary: 112 mg/dL — ABNORMAL HIGH (ref 70–99)

## 2025-01-24 LAB — BASIC METABOLIC PANEL WITH GFR
Anion gap: 8 (ref 5–15)
BUN: 21 mg/dL (ref 8–23)
CO2: 27 mmol/L (ref 22–32)
Calcium: 8.5 mg/dL — ABNORMAL LOW (ref 8.9–10.3)
Chloride: 102 mmol/L (ref 98–111)
Creatinine, Ser: 0.9 mg/dL (ref 0.61–1.24)
GFR, Estimated: >60 mL/min
Glucose, Bld: 103 mg/dL — ABNORMAL HIGH (ref 70–99)
Potassium: 4.6 mmol/L (ref 3.5–5.1)
Sodium: 137 mmol/L (ref 135–145)

## 2025-01-24 LAB — SURGICAL PATHOLOGY

## 2025-01-24 MED ORDER — IBUPROFEN 800 MG PO TABS
800.0000 mg | ORAL_TABLET | Freq: Three times a day (TID) | ORAL | 0 refills | Status: AC | PRN
  Filled 2025-01-24: qty 30, 10d supply, fill #0

## 2025-01-24 MED ORDER — CALCIUM CARBONATE 1250 (500 CA) MG PO TABS
2.0000 | ORAL_TABLET | Freq: Two times a day (BID) | ORAL | 0 refills | Status: AC
  Filled 2025-01-24: qty 120, 30d supply, fill #0

## 2025-01-24 MED ORDER — TRAMADOL HCL 50 MG PO TABS
50.0000 mg | ORAL_TABLET | Freq: Four times a day (QID) | ORAL | 0 refills | Status: AC | PRN
  Filled 2025-01-24: qty 10, 3d supply, fill #0

## 2025-01-24 MED ORDER — LEVOTHYROXINE SODIUM 100 MCG PO TABS
100.0000 ug | ORAL_TABLET | Freq: Every day | ORAL | 2 refills | Status: AC
  Filled 2025-01-24: qty 60, 60d supply, fill #0

## 2025-01-24 NOTE — Discharge Summary (Signed)
 " Physician Discharge Summary  William Sexton FMW:[id number] DOB: 09-12-1956 DOA: 01/23/2025  PCP: William Lenis, MD  Admit date: 01/23/2025 Discharge date:  01/24/2025   Recommendations for Outpatient Follow-up:   (include homehealth, outpatient follow-up instructions, specific recommendations for PCP to follow-up on, etc.)   Follow-up Information     Destine Ambroise, William Righter, MD Follow up on 02/14/2025.   Specialty: General Surgery Contact information: [address] [place] [address] [phone]                Discharge Diagnoses:  Principal Problem:   Multinodular goiter   Surgical Procedure: total thyroidectomy  Discharge Condition: Good Disposition: Home  Diet recommendation: reg diet   Hospital Course:  69 yo male with multinodular goiter underwent thyroidectomy. Post op he was observed on the surgical floor. He did well and was discharged hoem POD 1.  Discharge Instructions  Discharge Instructions     Diet - low sodium heart healthy   Complete by: As directed    Discharge wound care:   Complete by: As directed    Shower normal tomorrow. Glue to stay on for 10-14 days. No bandage needed.   Increase activity slowly   Complete by: As directed       Allergies as of 01/24/2025       Reactions   Carafate [sucralfate]    Burning/pain in mouth and throat        Medication List     TAKE these medications    acetaminophen  650 MG CR tablet Commonly known as: TYLENOL  Take 650 mg by mouth every 8 (eight) hours as needed for pain.   atorvastatin  40 MG tablet Commonly known as: LIPITOR Take 1 tablet (40 mg total) by mouth daily.   calcium  carbonate 1250 (500 Ca) MG tablet Commonly known as: OS-CAL - dosed in mg of elemental calcium  Take 2 tablets (2,500 mg total) by mouth 2 (two) times daily with a meal.   citalopram  40 MG tablet Commonly known as: CELEXA  Take 40 mg by mouth daily.   citalopram  20 MG tablet Commonly known as:  CELEXA  TAKE 1 TABLET(20 MG) BY MOUTH DAILY   empagliflozin 25 MG Tabs tablet Commonly known as: JARDIANCE Take 25 mg by mouth daily.   ezetimibe 10 MG tablet Commonly known as: ZETIA Take 10 mg by mouth daily.   gabapentin  300 MG capsule Commonly known as: NEURONTIN  TAKE 1 CAPSULE(300 MG) BY MOUTH THREE TIMES DAILY   hydrOXYzine  25 MG tablet Commonly known as: ATARAX  Take 25 mg by mouth every 6 (six) hours as needed for anxiety.   ibuprofen  800 MG tablet Commonly known as: ADVIL  Take 1 tablet (800 mg total) by mouth every 8 (eight) hours as needed.   levothyroxine  100 MCG tablet Commonly known as: Synthroid  Take 1 tablet (100 mcg total) by mouth daily before breakfast.   lisinopril  20 MG tablet Commonly known as: ZESTRIL  TAKE 1 TABLET(20 MG) BY MOUTH DAILY   multivitamin capsule Take 1 capsule by mouth daily.   ondansetron  4 MG tablet Commonly known as: ZOFRAN  Take 4 mg by mouth every 8 (eight) hours as needed for nausea or vomiting.   pantoprazole  20 MG tablet Commonly known as: PROTONIX  Take 20 mg by mouth daily. What changed: when to take this   polyethylene glycol 17 g packet Commonly known as: MIRALAX / GLYCOLAX Take 17 g by mouth daily.   senna-docusate 8.6-50 MG tablet Commonly known as: Senokot-S Take 2 tablets by mouth daily.  sucralfate 1 g tablet Commonly known as: CARAFATE Take 1 g by mouth daily.   tamsulosin  0.4 MG Caps capsule Commonly known as: FLOMAX  Take 1 capsule (0.4 mg total) by mouth daily.   traMADol  50 MG tablet Commonly known as: ULTRAM  Take 1 tablet (50 mg total) by mouth every 6 (six) hours as needed for moderate pain (pain score 4-6) or severe pain (pain score 7-10).               Discharge Care Instructions  (From admission, onward)           Start     Ordered   01/24/25 0000  Discharge wound care:       Comments: Shower normal tomorrow. Glue to stay on for 10-14 days. No bandage needed.   01/24/25 9166             Follow-up Information     Myrl Bynum, William Righter, MD Follow up on 02/14/2025.   Specialty: General Surgery Contact information: [address] [place] [address] [phone]                  The results of significant diagnostics from this hospitalization (including imaging, microbiology, ancillary and laboratory) are listed below for reference.    Significant Diagnostic Studies: No results found.  Labs: Basic Metabolic Panel: Recent Labs  Lab 01/19/25 1330 01/24/25 0401  NA 135 137  K 4.4 4.6  CL 98 102  CO2 29 27  GLUCOSE 96 103*  BUN 13 21  CREATININE 0.78 0.90  CALCIUM  9.3 8.5*   Liver Function Tests: Recent Labs  Lab 01/19/25 1330  AST 29  ALT 39  ALKPHOS 62  BILITOT 0.6  PROT 7.0  ALBUMIN  4.5    CBC: Recent Labs  Lab 01/19/25 1330 01/24/25 0401  WBC 5.6 9.7  HGB 15.3 13.2  HCT 44.9 38.7*  MCV 91.3 91.7  PLT 183 169    CBG: Recent Labs  Lab 01/23/25 1007 01/23/25 1136 01/23/25 1617 01/23/25 2236 01/24/25 0742  GLUCAP 152* 152* 179* 147* 112*    Principal Problem:   Multinodular goiter   Time coordinating discharge: 15 min   "

## 2025-01-24 NOTE — Progress Notes (Signed)
 S: tolerating diet fine, voice was normal but now issues clearing or some hoarseness O: BP (!) 112/54 (BP Location: Right Arm)   Pulse 66   Temp 97.7 F (36.5 C) (Oral)   Resp 18   Ht 5' 9 (1.753 m)   Wt 77.1 kg   SpO2 96%   BMI 25.10 kg/m  Gen: NAd Neuro: AOx4 Neck: incision c/d/I, soft  A/P POD 1 total thyroidectomy, Ca 8.5 from 9.3 -discharge home -synthroid  -oscal -pain control

## 2025-01-24 NOTE — Discharge Instructions (Signed)
[facility]      [facility] [phone]  THYROID/ PARATHYROID SURGERY: POST OP INSTRUCTIONS  Always review your discharge instruction sheet given to you by the facility where your surgery was performed.  IF YOU HAVE DISABILITY OR FAMILY LEAVE FORMS, YOU MUST BRING THEM TO THE OFFICE FOR PROCESSING.  PLEASE DO NOT GIVE THEM TO YOUR DOCTOR.  A prescription for pain medication may be given to you upon discharge.  Take your pain medication as prescribed, if needed.  If narcotic pain medicine is not needed, then you may take acetaminophen (Tylenol) or ibuprofen (Advil) as needed. Take your usually prescribed medications unless otherwise directed. If you need a refill on your pain medication, please contact your pharmacy. They will contact our office to request authorization.  Prescriptions will not be filled after 5pm or on week-ends. You should follow a light diet the first 24 hours after arrival home, such as soup and crackers, etc.  Be sure to include lots of fluids daily.  Resume your normal diet the day after surgery. Most patients will experience some swelling and bruising on the chest and neck area.  Ice packs will help.  Swelling and bruising can take several days to resolve.  It is common to experience some constipation if taking pain medication after surgery.  Increasing fluid intake and taking a stool softener will usually help or prevent this problem from occurring.  A mild laxative (Milk of Magnesia or Miralax) should be taken according to package directions if there are no bowel movements after 48 hours. Unless discharge instructions indicate otherwise, you may remove your bandages 24-48 hours after surgery, and you may shower at that time.  You may have steri-strips (small skin tapes) in place directly over the incision.  These strips should be left on the skin for 7-10 days.  If your surgeon used skin glue on the incision, you may shower in 24 hours.  The glue will flake off over  the next 2-3 weeks.  Any sutures or staples will be removed at the office during your follow-up visit. ACTIVITIES:  You may resume regular (light) daily activities beginning the next day--such as daily self-care, walking, climbing stairs--gradually increasing activities as tolerated.  You may have sexual intercourse when it is comfortable.  Refrain from any heavy lifting or straining until approved by your doctor. You may drive when you no longer are taking prescription pain medication, you can comfortably wear a seatbelt, and you can safely maneuver your car and apply brakes RETURN TO WORK:  __________________________________________________________ You should see your doctor in the office for a follow-up appointment approximately two weeks after your surgery.  Make sure that you call for this appointment within a day or two after you arrive home to insure a convenient appointment time. OTHER INSTRUCTIONS: ____________________________________________________________________________ _________________________________________________________________________________________________________________ _________________________________________________________________________________________________________________   WHEN TO CALL YOUR DOCTOR: Fever over 101.0 Inability to urinate Nausea and/or vomiting Extreme swelling or bruising Continued bleeding from incision. Increased pain, redness, or drainage from the incision. Difficulty swallowing or breathing Muscle cramping or spasms. Numbness or tingling in hands or feet or around lips.  The clinic staff is available to answer your questions during regular business hours.  Please don't hesitate to call and ask to speak to one of the nurses if you have concerns.  For further questions, please visit [email or web address]  

## 2025-04-16 ENCOUNTER — Ambulatory Visit: Admit: 2025-04-16

## 2025-04-30 ENCOUNTER — Ambulatory Visit: Admitting: Family Medicine
